# Patient Record
Sex: Male | Born: 1955
Health system: Southern US, Community
[De-identification: ages and names within clinical notes are randomized; demographics above are authoritative.]

## PROBLEM LIST (undated history)

## (undated) DIAGNOSIS — C8590 Non-Hodgkin lymphoma, unspecified, unspecified site: Secondary | ICD-10-CM

## (undated) DIAGNOSIS — E785 Hyperlipidemia, unspecified: Secondary | ICD-10-CM

## (undated) DIAGNOSIS — K7689 Other specified diseases of liver: Secondary | ICD-10-CM

## (undated) DIAGNOSIS — I10 Essential (primary) hypertension: Secondary | ICD-10-CM

## (undated) DIAGNOSIS — K219 Gastro-esophageal reflux disease without esophagitis: Secondary | ICD-10-CM

## (undated) DIAGNOSIS — K862 Cyst of pancreas: Secondary | ICD-10-CM

## (undated) DIAGNOSIS — K635 Polyp of colon: Secondary | ICD-10-CM

## (undated) DIAGNOSIS — R42 Dizziness and giddiness: Secondary | ICD-10-CM

## (undated) DIAGNOSIS — C8400 Mycosis fungoides, unspecified site: Secondary | ICD-10-CM

## (undated) DIAGNOSIS — I639 Cerebral infarction, unspecified: Secondary | ICD-10-CM

## (undated) HISTORY — PX: TONSILLECTOMY AND ADENOIDECTOMY: SHX28

## (undated) HISTORY — DX: Dizziness and giddiness: R42

## (undated) HISTORY — PX: OTHER SURGICAL HISTORY: SHX169

## (undated) HISTORY — PX: RETINAL DETACHMENT SURGERY: SHX105

## (undated) HISTORY — DX: Cerebral infarction, unspecified: I63.9

## (undated) HISTORY — DX: Other specified diseases of liver: K76.89

## (undated) HISTORY — DX: Mycosis fungoides, unspecified site: C84.00

## (undated) HISTORY — PX: TONSILLECTOMY: SUR1361

## (undated) HISTORY — PX: WISDOM TOOTH EXTRACTION: SHX21

## (undated) HISTORY — DX: Cyst of pancreas: K86.2

## (undated) HISTORY — PX: COLONOSCOPY: SHX174

---
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
## 2008-12-22 HISTORY — PX: OTHER SURGICAL HISTORY: SHX169

## 2017-02-27 DIAGNOSIS — L309 Dermatitis, unspecified: Secondary | ICD-10-CM | POA: Diagnosis not present

## 2017-02-27 DIAGNOSIS — I10 Essential (primary) hypertension: Secondary | ICD-10-CM | POA: Diagnosis not present

## 2017-04-21 DIAGNOSIS — C8590 Non-Hodgkin lymphoma, unspecified, unspecified site: Secondary | ICD-10-CM

## 2017-04-21 HISTORY — DX: Non-Hodgkin lymphoma, unspecified, unspecified site: C85.90

## 2017-05-14 DIAGNOSIS — R238 Other skin changes: Secondary | ICD-10-CM | POA: Diagnosis not present

## 2017-05-14 DIAGNOSIS — D485 Neoplasm of uncertain behavior of skin: Secondary | ICD-10-CM | POA: Diagnosis not present

## 2017-05-14 DIAGNOSIS — C8409 Mycosis fungoides, extranodal and solid organ sites: Secondary | ICD-10-CM | POA: Diagnosis not present

## 2017-06-10 DIAGNOSIS — C8400 Mycosis fungoides, unspecified site: Secondary | ICD-10-CM | POA: Diagnosis not present

## 2017-06-10 DIAGNOSIS — I10 Essential (primary) hypertension: Secondary | ICD-10-CM | POA: Diagnosis not present

## 2017-06-29 DIAGNOSIS — C8409 Mycosis fungoides, extranodal and solid organ sites: Secondary | ICD-10-CM | POA: Diagnosis not present

## 2017-06-29 DIAGNOSIS — R21 Rash and other nonspecific skin eruption: Secondary | ICD-10-CM | POA: Diagnosis not present

## 2017-06-29 DIAGNOSIS — C84A0 Cutaneous T-cell lymphoma, unspecified, unspecified site: Secondary | ICD-10-CM | POA: Diagnosis not present

## 2017-06-29 DIAGNOSIS — L986 Other infiltrative disorders of the skin and subcutaneous tissue: Secondary | ICD-10-CM | POA: Diagnosis not present

## 2017-07-07 DIAGNOSIS — L986 Other infiltrative disorders of the skin and subcutaneous tissue: Secondary | ICD-10-CM | POA: Diagnosis not present

## 2017-07-28 DIAGNOSIS — C8400 Mycosis fungoides, unspecified site: Secondary | ICD-10-CM | POA: Diagnosis not present

## 2017-07-29 DIAGNOSIS — E291 Testicular hypofunction: Secondary | ICD-10-CM | POA: Diagnosis not present

## 2017-07-29 DIAGNOSIS — I10 Essential (primary) hypertension: Secondary | ICD-10-CM | POA: Diagnosis not present

## 2017-08-25 DIAGNOSIS — Z0389 Encounter for observation for other suspected diseases and conditions ruled out: Secondary | ICD-10-CM | POA: Diagnosis not present

## 2017-08-25 DIAGNOSIS — E291 Testicular hypofunction: Secondary | ICD-10-CM | POA: Diagnosis not present

## 2017-08-25 DIAGNOSIS — I10 Essential (primary) hypertension: Secondary | ICD-10-CM | POA: Diagnosis not present

## 2017-10-09 DIAGNOSIS — Z23 Encounter for immunization: Secondary | ICD-10-CM | POA: Diagnosis not present

## 2017-10-29 DIAGNOSIS — I10 Essential (primary) hypertension: Secondary | ICD-10-CM | POA: Diagnosis not present

## 2017-10-30 ENCOUNTER — Encounter (INDEPENDENT_AMBULATORY_CARE_PROVIDER_SITE_OTHER): Payer: Self-pay | Admitting: *Deleted

## 2017-12-08 DIAGNOSIS — C8400 Mycosis fungoides, unspecified site: Secondary | ICD-10-CM | POA: Insufficient documentation

## 2017-12-08 DIAGNOSIS — L986 Other infiltrative disorders of the skin and subcutaneous tissue: Secondary | ICD-10-CM | POA: Diagnosis not present

## 2017-12-23 DIAGNOSIS — C8400 Mycosis fungoides, unspecified site: Secondary | ICD-10-CM | POA: Diagnosis not present

## 2017-12-30 ENCOUNTER — Other Ambulatory Visit (INDEPENDENT_AMBULATORY_CARE_PROVIDER_SITE_OTHER): Payer: Self-pay | Admitting: *Deleted

## 2017-12-30 ENCOUNTER — Encounter (INDEPENDENT_AMBULATORY_CARE_PROVIDER_SITE_OTHER): Payer: Self-pay | Admitting: *Deleted

## 2017-12-30 DIAGNOSIS — Z85038 Personal history of other malignant neoplasm of large intestine: Secondary | ICD-10-CM

## 2017-12-30 DIAGNOSIS — Z800 Family history of malignant neoplasm of digestive organs: Secondary | ICD-10-CM | POA: Insufficient documentation

## 2017-12-31 DIAGNOSIS — C8400 Mycosis fungoides, unspecified site: Secondary | ICD-10-CM | POA: Diagnosis not present

## 2018-01-07 DIAGNOSIS — R9721 Rising PSA following treatment for malignant neoplasm of prostate: Secondary | ICD-10-CM | POA: Diagnosis not present

## 2018-01-07 DIAGNOSIS — N529 Male erectile dysfunction, unspecified: Secondary | ICD-10-CM | POA: Diagnosis not present

## 2018-01-07 DIAGNOSIS — R5383 Other fatigue: Secondary | ICD-10-CM | POA: Diagnosis not present

## 2018-01-07 DIAGNOSIS — Z125 Encounter for screening for malignant neoplasm of prostate: Secondary | ICD-10-CM | POA: Diagnosis not present

## 2018-01-07 DIAGNOSIS — I10 Essential (primary) hypertension: Secondary | ICD-10-CM | POA: Diagnosis not present

## 2018-01-07 DIAGNOSIS — R972 Elevated prostate specific antigen [PSA]: Secondary | ICD-10-CM | POA: Diagnosis not present

## 2018-01-19 DIAGNOSIS — R972 Elevated prostate specific antigen [PSA]: Secondary | ICD-10-CM | POA: Diagnosis not present

## 2018-01-22 DIAGNOSIS — G459 Transient cerebral ischemic attack, unspecified: Secondary | ICD-10-CM

## 2018-01-22 HISTORY — DX: Transient cerebral ischemic attack, unspecified: G45.9

## 2018-01-30 ENCOUNTER — Emergency Department (HOSPITAL_COMMUNITY): Payer: 59

## 2018-01-30 ENCOUNTER — Observation Stay (HOSPITAL_COMMUNITY)
Admission: EM | Admit: 2018-01-30 | Discharge: 2018-02-01 | Disposition: A | Discharge status: Home / Self Care | Payer: 59 | Attending: Internal Medicine | Admitting: Internal Medicine

## 2018-01-30 ENCOUNTER — Encounter (HOSPITAL_COMMUNITY): Payer: Self-pay | Admitting: Emergency Medicine

## 2018-01-30 DIAGNOSIS — C8590 Non-Hodgkin lymphoma, unspecified, unspecified site: Secondary | ICD-10-CM | POA: Diagnosis not present

## 2018-01-30 DIAGNOSIS — Z7982 Long term (current) use of aspirin: Secondary | ICD-10-CM | POA: Diagnosis not present

## 2018-01-30 DIAGNOSIS — E041 Nontoxic single thyroid nodule: Secondary | ICD-10-CM | POA: Insufficient documentation

## 2018-01-30 DIAGNOSIS — E785 Hyperlipidemia, unspecified: Secondary | ICD-10-CM | POA: Insufficient documentation

## 2018-01-30 DIAGNOSIS — E669 Obesity, unspecified: Secondary | ICD-10-CM | POA: Diagnosis not present

## 2018-01-30 DIAGNOSIS — Z6832 Body mass index (BMI) 32.0-32.9, adult: Secondary | ICD-10-CM | POA: Insufficient documentation

## 2018-01-30 DIAGNOSIS — I10 Essential (primary) hypertension: Secondary | ICD-10-CM | POA: Insufficient documentation

## 2018-01-30 DIAGNOSIS — I639 Cerebral infarction, unspecified: Secondary | ICD-10-CM | POA: Diagnosis not present

## 2018-01-30 DIAGNOSIS — R42 Dizziness and giddiness: Secondary | ICD-10-CM | POA: Diagnosis not present

## 2018-01-30 HISTORY — DX: Essential (primary) hypertension: I10

## 2018-01-30 HISTORY — DX: Non-Hodgkin lymphoma, unspecified, unspecified site: C85.90

## 2018-01-30 LAB — BASIC METABOLIC PANEL
Anion gap: 9 (ref 5–15)
BUN: 17 mg/dL (ref 6–20)
CALCIUM: 9.5 mg/dL (ref 8.9–10.3)
CO2: 28 mmol/L (ref 22–32)
CREATININE: 0.81 mg/dL (ref 0.61–1.24)
Chloride: 101 mmol/L (ref 101–111)
GFR calc non Af Amer: >60 mL/min (ref >60)
Glucose, Bld: 121 mg/dL — ABNORMAL HIGH (ref 65–99)
Potassium: 4.1 mmol/L (ref 3.5–5.1)
SODIUM: 138 mmol/L (ref 135–145)

## 2018-01-30 LAB — URINALYSIS, ROUTINE W REFLEX MICROSCOPIC
Bilirubin Urine: NEGATIVE
Glucose, UA: NEGATIVE mg/dL
HGB URINE DIPSTICK: NEGATIVE
KETONES UR: NEGATIVE mg/dL
LEUKOCYTES UA: NEGATIVE
Nitrite: NEGATIVE
PROTEIN: NEGATIVE mg/dL
Specific Gravity, Urine: 1.008 (ref 1.005–1.030)
pH: 8 (ref 5.0–8.0)

## 2018-01-30 LAB — CBC
HCT: 47 % (ref 39.0–52.0)
Hemoglobin: 15.7 g/dL (ref 13.0–17.0)
MCH: 30.8 pg (ref 26.0–34.0)
MCHC: 33.4 g/dL (ref 30.0–36.0)
MCV: 92.3 fL (ref 78.0–100.0)
PLATELETS: 228 10*3/uL (ref 150–400)
RBC: 5.09 MIL/uL (ref 4.22–5.81)
RDW: 14.2 % (ref 11.5–15.5)
WBC: 9.4 10*3/uL (ref 4.0–10.5)

## 2018-01-30 LAB — CBG MONITORING, ED: GLUCOSE-CAPILLARY: 130 mg/dL — AB (ref 65–99)

## 2018-01-30 MED ORDER — ASPIRIN 300 MG RE SUPP: 300.0000 mg | Freq: Every day | RECTAL | Status: DC

## 2018-01-30 MED ORDER — ACETAMINOPHEN 325 MG PO TABS: 650.0000 mg | ORAL_TABLET | ORAL | Status: DC | PRN

## 2018-01-30 MED ORDER — ASPIRIN 81 MG PO CHEW
324.0000 mg | CHEWABLE_TABLET | Freq: Once | ORAL | Status: AC
  Administered 2018-01-30: 324 mg via ORAL
  Filled 2018-01-30: qty 4

## 2018-01-30 MED ORDER — ACETAMINOPHEN 650 MG RE SUPP: 650.0000 mg | RECTAL | Status: DC | PRN

## 2018-01-30 MED ORDER — ACETAMINOPHEN 160 MG/5ML PO SOLN: 650.0000 mg | ORAL | Status: DC | PRN

## 2018-01-30 MED ORDER — STROKE: EARLY STAGES OF RECOVERY BOOK
Freq: Once | Status: AC
  Administered 2018-02-01: 08:00:00
  Filled 2018-01-30: qty 1

## 2018-01-30 MED ORDER — MECLIZINE HCL 12.5 MG PO TABS
25.0000 mg | ORAL_TABLET | Freq: Once | ORAL | Status: AC
  Administered 2018-01-30: 25 mg via ORAL
  Filled 2018-01-30: qty 2

## 2018-01-30 MED ORDER — ASPIRIN 325 MG PO TABS
325.0000 mg | ORAL_TABLET | Freq: Every day | ORAL | Status: DC
  Administered 2018-01-31 – 2018-02-01 (×2): 325 mg via ORAL
  Filled 2018-01-30 (×2): qty 1

## 2018-01-30 NOTE — H&P (Signed)
History and Physical    Scott Mathis VVO:[id number] DOB: [birth year] DOA: 01/30/2018  PCP: Doree Albee, MD  Patient coming from: home  Chief Complaint: Imbalance and dizziness  HPI: Scott Mathis is a 62 y.o. male with medical history significant of hypertension, lymphoma on methotrexate comes in with being dizzy and very imbalanced in his gait since 3 AM this morning.  He went to bed last night normal.  He woke up this morning at 3 AM to go to the bathroom.  He noticed that he was extremely dizzy and was unbalanced.  He went back to bed and fell asleep.  Woke up again later this morning and the symptoms persisted.  Patient denies any focal weakness anywhere.  He denies any facial drooping.  He denies any numbness tingling in his face or any extremities.  He denies any recent fever.  No recent nausea vomiting or diarrhea.  He still gets very dizzy when he gets up to move.  His gait has not improved either.  Patient has been found to have a possible infarct in his cerebellum on initial CAT scan.  Patient is referred for admission for possible stroke.  Neurology at  [facility] has been called who recommended transfer to [facility] for further neurological evaluation.  Review of Systems: As per HPI otherwise 10 point review of systems negative.   Past Medical History:  Diagnosis Date  . Hypertension   . Lymphoma [facility])     Past Surgical History:  Procedure Laterality Date  . TONSILLECTOMY       reports that  has never smoked. He does not have any smokeless tobacco history on file. He reports that he does not drink alcohol or use drugs.  No Known Allergies  History reviewed. No pertinent family history. no premature CAD he has a twin brother who is identical who also has lymphoma no strokes run in the family.  Prior to Admission medications   Medication Sig Start Date End Date Taking? Authorizing Provider  amLODipine (NORVASC) 5 MG tablet Take 5 mg by mouth daily. 01/13/18  Yes  [provider]  aspirin 81 MG EC tablet Take 81 mg by mouth daily. Swallow whole.   Yes [provider]  folic acid (FOLVITE) 1 MG tablet Take 1 mg by mouth daily. Daily for 6 days. Methotrexate on the 7th day. Repeat 01/12/18  Yes [provider]  methotrexate (RHEUMATREX) 2.5 MG tablet Take 2.5 mg by mouth every 7 (seven) days. Takes Folic Acid on the other 6 days. 12/08/17  Yes [provider]  Multiple Vitamins-Minerals (MULTIVITAMIN MEN 50+) TABS Take 1 tablet by mouth daily.   Yes [provider]  Omega-3 Fatty Acids (FISH OIL) 1200 MG CAPS Take 1 capsule by mouth daily.   Yes [provider]    Physical Exam: Vitals:   01/30/18 1142 01/30/18 1144 01/30/18 1458 01/30/18 1658  BP:  (!) 148/85 (!) 146/87 (!) 143/90  Pulse:  66 60 67  Resp:  18 15 17   Temp:  97.8 F (36.6 C)    TempSrc:  Oral    SpO2:  99% 96% 97%  Weight: 97.5 kg (215 lb)     Height: 5\' 8"  (1.727 m)        Constitutional: NAD, calm, comfortable Vitals:   01/30/18 1142 01/30/18 1144 01/30/18 1458 01/30/18 1658  BP:  (!) 148/85 (!) 146/87 (!) 143/90  Pulse:  66 60 67  Resp:  18 15 17   Temp:  97.8 F (36.6 C)    TempSrc:  Oral    SpO2:  99% 96% 97%  Weight: 97.5 kg (215 lb)     Height: 5\' 8"  (1.727 m)      Eyes: PERRL, lids and conjunctivae normal ENMT: Mucous membranes are moist. Posterior pharynx clear of any exudate or lesions.Normal dentition.  Neck: normal, supple, no masses, no thyromegaly Respiratory: clear to auscultation bilaterally, no wheezing, no crackles. Normal respiratory effort. No accessory muscle use.  Cardiovascular: Regular rate and rhythm, no murmurs / rubs / gallops. No extremity edema. 2+ pedal pulses. No carotid bruits.  Abdomen: no tenderness, no masses palpated. No hepatosplenomegaly. Bowel sounds positive.  Musculoskeletal: no clubbing / cyanosis. No joint deformity upper and lower extremities. Good ROM, no contractures. Normal  muscle tone.  Skin: no rashes, lesions, ulcers. No induration Neurologic: CN 2-12 grossly intact. Sensation intact, DTR normal. Strength 5/5 in all 4.  Psychiatric: Normal judgment and insight. Alert and oriented x 3. Normal mood.    Labs on Admission: I have personally reviewed following labs and imaging studies  CBC: Recent Labs  Lab 01/30/18 1252  WBC 9.4  HGB 15.7  HCT 47.0  MCV 92.3  PLT 932   Basic Metabolic Panel: Recent Labs  Lab 01/30/18 1252  NA 138  K 4.1  CL 101  CO2 28  GLUCOSE 121*  BUN 17  CREATININE 0.81  CALCIUM 9.5   GFR: Estimated Creatinine Clearance: 108.4 mL/min (by C-G formula based on SCr of 0.81 mg/dL). Liver Function Tests: No results for input(s): AST, ALT, ALKPHOS, BILITOT, PROT, ALBUMIN in the last 168 hours. No results for input(s): LIPASE, AMYLASE in the last 168 hours. No results for input(s): AMMONIA in the last 168 hours. Coagulation Profile: No results for input(s): INR, PROTIME in the last 168 hours. Cardiac Enzymes: No results for input(s): CKTOTAL, CKMB, CKMBINDEX, TROPONINI in the last 168 hours. BNP (last 3 results) No results for input(s): PROBNP in the last 8760 hours. HbA1C: No results for input(s): HGBA1C in the last 72 hours. CBG: Recent Labs  Lab 01/30/18 1148  GLUCAP 130*   Lipid Profile: No results for input(s): CHOL, HDL, LDLCALC, TRIG, CHOLHDL, LDLDIRECT in the last 72 hours. Thyroid Function Tests: No results for input(s): TSH, T4TOTAL, FREET4, T3FREE, THYROIDAB in the last 72 hours. Anemia Panel: No results for input(s): VITAMINB12, FOLATE, FERRITIN, TIBC, IRON, RETICCTPCT in the last 72 hours. Urine analysis:    Component Value Date/Time   COLORURINE STRAW (A) 01/30/2018 1146   APPEARANCEUR HAZY (A) 01/30/2018 1146   LABSPEC 1.008 01/30/2018 1146   PHURINE 8.0 01/30/2018 1146   GLUCOSEU NEGATIVE 01/30/2018 1146   HGBUR NEGATIVE 01/30/2018 1146   BILIRUBINUR NEGATIVE 01/30/2018 1146   KETONESUR  NEGATIVE 01/30/2018 1146   PROTEINUR NEGATIVE 01/30/2018 1146   NITRITE NEGATIVE 01/30/2018 1146   LEUKOCYTESUR NEGATIVE 01/30/2018 1146   Sepsis Labs: !!!!!!!!!!!!!!!!!!!!!!!!!!!!!!!!!!!!!!!!!!!! @LABRCNTIP (procalcitonin:4,lacticidven:4) )No results found for this or any previous visit (from the past 240 hour(s)).   Radiological Exams on Admission: Ct Head Wo Contrast  Result Date: 01/30/2018 CLINICAL DATA:  Dizziness and nausea. Persistent gait disturbance. History of lymphoma. EXAM: CT HEAD WITHOUT CONTRAST TECHNIQUE: Contiguous axial images were obtained from the base of the skull through the vertex without intravenous contrast. COMPARISON:  None. FINDINGS: Brain: The ventricles are normal in size and configuration. There is no mass, hemorrhage, extra-axial fluid collection, or midline shift. There is decreased attenuation in the right mid cerebellum along the lateral aspect the  right dentate nucleus compared to the left side. A focal developing infarct in the right mid cerebellum must be of concern given this appearance. Elsewhere gray-white compartments appear normal. Vascular: No hyperdense vessel evident. There is calcification in each carotid siphon. Skull: The bony calvarium appears intact. Sinuses/Orbits: There is mucosal thickening in several ethmoid air cells. Other visualized paranasal sinuses are clear. Orbits appear symmetric bilaterally. Other: Mastoid air cells are clear. IMPRESSION: Focal decreased attenuation in the mid right cerebellum in the lateral right dentate nucleus region compared to the left side. Early infarct in this area must be of concern. Elsewhere gray-white compartments appear normal. No mass or hemorrhage. There are foci of arterial vascular calcification. There is mucosal thickening in several ethmoid air cells. Electronically Signed   By: Lowella Grip III M.D.   On: 01/30/2018 15:27    EKG: Independently reviewed. nsr no acute issues Case discussed with Dr.  Vanita Panda in the emergency department   Assessment/Plan 62 year old male with gait abnormality and dizziness with possible early infarct on CT of head Principal Problem:   Stroke (HCC)-transfer to call for full evaluation for stroke.  Obtain MRI of the brain to assess if indeed infarct.  Obtain carotid Dopplers.  Obtain cardiac echo.  Placed on full dose of aspirin.  Placed on telemetry monitoring.  Check fasting lipid panel and hemoglobin A1c.  Obtain frequent neurological checks.  I have advised the patient to let us know if he has any worsening neurological symptoms.  Patient be transferred to [facility] for inpatient neurological consultation and evaluation.  Have added a care order to call neuro team on arrival to come.  Active Problems:   Lymphoma (HCC)-holding methotrexate   Hypertension-allow permissive hypertension at this time     DVT prophylaxis:  scds Code Status:  full Family Communication: wife Disposition Plan:  Per day team Consults called:  [facility] Admission status:  admission   Roni,Catalena Stanhope A MD [facility]  If 7PM-7AM, please contact night-coverage [email or web address] Password [email or web address]  01/30/2018, 5:13 PM

## 2018-01-30 NOTE — ED Triage Notes (Signed)
Pt reports he woke up at 0300 to use the bathroom and became very dizzy with the room spinning and nausea.  Pt very off balance when walking into triage.

## 2018-01-30 NOTE — ED Notes (Signed)
EKG given to Dr. McManus.  

## 2018-01-30 NOTE — ED Provider Notes (Signed)
[facility] EMERGENCY DEPARTMENT Provider Note   CSN: [id number] Arrival date & time: 01/30/18  1135     History   Chief Complaint Chief Complaint  Patient presents with  . Dizziness    HPI Scott Mathis is a 62 y.o. male.  HPI Patient presents with dizziness. Patient has history of hypertension and lymphoma, is currently taking methotrexate and folic acid for treatment. He awoke this morning about 6 hours ago, with dizziness, described as disequilibrium, not near syncope. Since onset symptoms been persistent, difficulty with ambulation Symptoms are worse with motion, ambulation. Symptoms are minimally better at rest. No vomiting, though he is nauseous. No confusion, disorientation, vision changes. He is here with his wife who assists with the HPI. Past Medical History:  Diagnosis Date  . Hypertension   . Lymphoma [facility])     Patient Active Problem List   Diagnosis Date Noted  . Stroke ([place]) 01/30/2018  . Lymphoma ([place])   . Hypertension   . Family history of colon cancer 12/30/2017  . History of colon cancer 12/30/2017    Past Surgical History:  Procedure Laterality Date  . TONSILLECTOMY         Home Medications    Prior to Admission medications   Medication Sig Start Date End Date Taking? Authorizing Provider  amLODipine (NORVASC) 5 MG tablet Take 5 mg by mouth daily. 01/13/18  Yes [provider]  aspirin 81 MG EC tablet Take 81 mg by mouth daily. Swallow whole.   Yes [provider]  folic acid (FOLVITE) 1 MG tablet Take 1 mg by mouth daily. Daily for 6 days. Methotrexate on the 7th day. Repeat 01/12/18  Yes [provider]  methotrexate (RHEUMATREX) 2.5 MG tablet Take 2.5 mg by mouth every 7 (seven) days. Takes Folic Acid on the other 6 days. 12/08/17  Yes [provider]  Multiple Vitamins-Minerals (MULTIVITAMIN MEN 50+) TABS Take 1 tablet by mouth daily.   Yes [provider]  Omega-3 Fatty Acids (FISH OIL)  1200 MG CAPS Take 1 capsule by mouth daily.   Yes [provider]    Family History History reviewed. No pertinent family history.  Social History Social History   Tobacco Use  . Smoking status: Never Smoker  Substance Use Topics  . Alcohol use: No    Frequency: Never  . Drug use: No     Allergies   Patient has no known allergies.   Review of Systems Review of Systems  Constitutional:       Per HPI, otherwise negative  HENT:       Per HPI, otherwise negative  Respiratory:       Per HPI, otherwise negative  Cardiovascular:       Per HPI, otherwise negative  Gastrointestinal: Negative for vomiting.  Endocrine:       Negative aside from HPI  Genitourinary:       Neg aside from HPI   Musculoskeletal:       Per HPI, otherwise negative  Skin: Negative.   Allergic/Immunologic: Positive for immunocompromised state.  Neurological: Positive for dizziness. Negative for syncope.     Physical Exam Updated Vital Signs BP (!) 160/89 (BP Location: Right Arm)   Pulse 60   Temp 97.9 F (36.6 C) (Oral)   Resp 15   Ht 5\' 8"  (1.727 m)   Wt 97.5 kg (215 lb)   SpO2 98%   BMI 32.69 kg/m   Physical Exam  Constitutional: He is oriented to person, place,  and time. He appears well-developed. No distress.  HENT:  Head: Normocephalic and atraumatic.  Eyes: Conjunctivae and EOM are normal.  Cardiovascular: Normal rate and regular rhythm.  Pulmonary/Chest: Effort normal. No stridor. No respiratory distress.  Abdominal: He exhibits no distension.  Musculoskeletal: He exhibits no edema.  Neurological: He is alert and oriented to person, place, and time. No cranial nerve deficit. He exhibits normal muscle tone. Coordination normal.  Head motion with flexion/extension of the neck provokes symptoms.  Skin: Skin is warm and dry.  Psychiatric: He has a normal mood and affect.  Nursing note and vitals reviewed.    ED Treatments / Results  Labs (all labs ordered are  listed, but only abnormal results are displayed) Labs Reviewed  BASIC METABOLIC PANEL - Abnormal; Notable for the following components:      Result Value   Glucose, Bld 121 (*)    All other components within normal limits  URINALYSIS, ROUTINE W REFLEX MICROSCOPIC - Abnormal; Notable for the following components:   Color, Urine STRAW (*)    APPearance HAZY (*)    All other components within normal limits  CBG MONITORING, ED - Abnormal; Notable for the following components:   Glucose-Capillary 130 (*)    All other components within normal limits  CBC  HIV ANTIBODY (ROUTINE TESTING)  HEMOGLOBIN A1C  LIPID PANEL    EKG  EKG Interpretation  Date/Time:  Saturday January 30 2018 11:43:04 EST Ventricular Rate:  66 PR Interval:  166 QRS Duration: 98 QT Interval:  412 QTC Calculation: 431 R Axis:   58 Text Interpretation:  Normal sinus rhythm Minimal voltage criteria for LVH, may be normal variant Borderline ECG No previous ECGs available Confirmed by Fredia Sorrow [phone]) on 01/30/2018 5:00:38 PM       Radiology Ct Head Wo Contrast  Result Date: 01/30/2018 CLINICAL DATA:  Dizziness and nausea. Persistent gait disturbance. History of lymphoma. EXAM: CT HEAD WITHOUT CONTRAST TECHNIQUE: Contiguous axial images were obtained from the base of the skull through the vertex without intravenous contrast. COMPARISON:  None. FINDINGS: Brain: The ventricles are normal in size and configuration. There is no mass, hemorrhage, extra-axial fluid collection, or midline shift. There is decreased attenuation in the right mid cerebellum along the lateral aspect the right dentate nucleus compared to the left side. A focal developing infarct in the right mid cerebellum must be of concern given this appearance. Elsewhere gray-white compartments appear normal. Vascular: No hyperdense vessel evident. There is calcification in each carotid siphon. Skull: The bony calvarium appears intact. Sinuses/Orbits: There is  mucosal thickening in several ethmoid air cells. Other visualized paranasal sinuses are clear. Orbits appear symmetric bilaterally. Other: Mastoid air cells are clear. IMPRESSION: Focal decreased attenuation in the mid right cerebellum in the lateral right dentate nucleus region compared to the left side. Early infarct in this area must be of concern. Elsewhere gray-white compartments appear normal. No mass or hemorrhage. There are foci of arterial vascular calcification. There is mucosal thickening in several ethmoid air cells. Electronically Signed   By: Lowella Grip III M.D.   On: 01/30/2018 15:27    Procedures Procedures (including critical care time)  Medications Ordered in ED Medications   stroke: mapping our early stages of recovery book (not administered)  acetaminophen (TYLENOL) tablet 650 mg (not administered)    Or  acetaminophen (TYLENOL) solution 650 mg (not administered)    Or  acetaminophen (TYLENOL) suppository 650 mg (not administered)  aspirin suppository 300 mg (not administered)  Or  aspirin tablet 325 mg (not administered)  meclizine (ANTIVERT) tablet 25 mg (25 mg Oral Given 01/30/18 1503)  aspirin chewable tablet 324 mg (324 mg Oral Given 01/30/18 1642)     Initial Impression / Assessment and Plan / ED Course  I have reviewed the triage vital signs and the nursing notes.  Pertinent labs & imaging results that were available during my care of the patient were reviewed by me and considered in my medical decision making (see chart for details).     On repeat exam the patient is in similar condition, persistent dizziness, mild.  Update:, I have discussed the patient's CT with him and with her neurologist. Given patient's description of new dizziness, onset sudden, this morning and abnormal cerebellar finding on CT, there is some concern for stroke. Patient will require transfer to our facility and stroke center for further evaluation, management, MRI. Patient's  studies otherwise are reassuring, with no other indication for other acute pathology. Patient and his wife are aware of all findings thus far, need for further evaluation, management, monitoring. Patient appropriate for transfer.  Final Clinical Impressions(s) / ED Diagnoses   Final diagnoses:  Lymphoma, unspecified body region, unspecified lymphoma type [facility])  Essential hypertension  Stroke [facility])    ED Discharge Orders    None       Carmin Muskrat, MD 01/30/18 2121

## 2018-01-31 ENCOUNTER — Encounter (HOSPITAL_COMMUNITY): Payer: Self-pay

## 2018-01-31 ENCOUNTER — Inpatient Hospital Stay (HOSPITAL_COMMUNITY): Payer: 59

## 2018-01-31 ENCOUNTER — Other Ambulatory Visit: Payer: Self-pay

## 2018-01-31 DIAGNOSIS — I6300 Cerebral infarction due to thrombosis of unspecified precerebral artery: Secondary | ICD-10-CM | POA: Diagnosis not present

## 2018-01-31 DIAGNOSIS — I6521 Occlusion and stenosis of right carotid artery: Secondary | ICD-10-CM | POA: Diagnosis not present

## 2018-01-31 DIAGNOSIS — I10 Essential (primary) hypertension: Secondary | ICD-10-CM | POA: Diagnosis not present

## 2018-01-31 DIAGNOSIS — R9089 Other abnormal findings on diagnostic imaging of central nervous system: Secondary | ICD-10-CM | POA: Diagnosis not present

## 2018-01-31 DIAGNOSIS — C8590 Non-Hodgkin lymphoma, unspecified, unspecified site: Secondary | ICD-10-CM | POA: Diagnosis not present

## 2018-01-31 DIAGNOSIS — I639 Cerebral infarction, unspecified: Secondary | ICD-10-CM | POA: Diagnosis not present

## 2018-01-31 MED ORDER — GADOBENATE DIMEGLUMINE 529 MG/ML IV SOLN
20.0000 mL | Freq: Once | INTRAVENOUS | Status: AC | PRN
  Administered 2018-01-31: 20 mL via INTRAVENOUS

## 2018-01-31 MED ORDER — IOPAMIDOL (ISOVUE-370) INJECTION 76%
INTRAVENOUS | Status: AC
  Administered 2018-01-31: 50 mL
  Filled 2018-01-31: qty 50

## 2018-01-31 MED ORDER — FOLIC ACID 1 MG PO TABS
1.0000 mg | ORAL_TABLET | Freq: Every day | ORAL | Status: DC
  Administered 2018-02-01: 1 mg via ORAL
  Filled 2018-01-31: qty 1

## 2018-01-31 NOTE — Evaluation (Signed)
Occupational Therapy Evaluation Patient Details Name: Scott Mathis MRN: [id number] DOB: 10-09-1956 Today's Date: 01/31/2018    History of Present Illness 62 y.o. 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 MRI pending.   Clinical Impression   Pt admitted with the above diagnoses and presents with below problem list. Pt will benefit from continued acute OT to address the below listed deficits and maximize independence with basic ADLs prior to d/c home. PTA pt was independent with ADLs, active, works from home. Pt presents with higher level balance deficits. Functionally he is mostly supervision with functional transfers and LB ADLs, occasional min guard for dynamic tasks. Will continue to follow acutely.       Follow Up Recommendations  No OT follow up    Equipment Recommendations  None recommended by OT    Recommendations for Other Services PT consult     Precautions / Restrictions Restrictions Weight Bearing Restrictions: No      Mobility Bed Mobility Overal bed mobility: Independent                Transfers Overall transfer level: Needs assistance Equipment used: None Transfers: Sit to/from Stand Sit to Stand: Supervision         General transfer comment: supervision for safety    Balance Overall balance assessment: Needs assistance Sitting-balance support: No upper extremity supported;Feet supported Sitting balance-Leahy Scale: Good     Standing balance support: No upper extremity supported Standing balance-Leahy Scale: Good Standing balance comment: higher level deficits                           ADL either performed or assessed with clinical judgement   ADL Overall ADL's : Needs assistance/impaired Eating/Feeding: Set up;Sitting    Grooming: Modified independent;Supervision/safety;Standing   Upper Body Bathing: Set up;Sitting   Lower Body Bathing: Supervison/ safety;Min guard;Sit to/from stand   Upper Body Dressing : Set up;Sitting   Lower Body Dressing: Min guard;Supervision/safety;Sit to/from stand   Toilet Transfer: Supervision/safety;Min guard   Toileting- Water quality scientist and Hygiene: Supervision/safety   Tub/ Shower Transfer: Walk-in shower;Supervision/safety;Min guard;Ambulation   Functional mobility during ADLs: Supervision/safety;Min guard General ADL Comments: Pt completed in-room functional mobility. Higher level balance deficits noted.      Vision Patient Visual Report: No change from baseline       Perception     Praxis      Pertinent Vitals/Pain Pain Assessment: No/denies pain     Hand Dominance     Extremity/Trunk Assessment Upper Extremity Assessment Upper Extremity Assessment: Overall WFL for tasks assessed   Lower Extremity Assessment Lower Extremity Assessment: Defer to PT evaluation   Cervical / Trunk Assessment Cervical / Trunk Assessment: Normal   Communication Communication Communication: No difficulties   Cognition Arousal/Alertness: Awake/alert Behavior During Therapy: WFL for tasks assessed/performed Overall Cognitive Status: Within Functional Limits for tasks assessed                                     General Comments       Exercises     Shoulder Instructions      Home Living Family/patient expects to be discharged to:: Private residence Living Arrangements: Spouse/significant other Available Help at Discharge: Family  Type of Home: House Home Access: Stairs to enter [employer] of Steps: 3 Entrance Stairs-Rails: Right;Left;Can reach both Home Layout: One level     Bathroom Shower/Tub: Chief Strategy Officer: None          Prior Functioning/Environment Level of Independence: Independent         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        OT Problem List: Impaired balance (sitting and/or standing);Decreased knowledge of use of DME or AE;Decreased knowledge of precautions      OT Treatment/Interventions: Self-care/ADL training;Neuromuscular education;DME and/or AE instruction;Therapeutic activities;Patient/family education;Balance training    OT Goals(Current goals can be found in the care plan section) Acute Rehab OT Goals Patient Stated Goal: home OT Goal Formulation: With patient Time For Goal Achievement: 02/07/18 Potential to Achieve Goals: Good ADL Goals Pt Will Perform Grooming: Independently;standing Pt Will Perform Lower Body Dressing: Independently;sit to/from stand  OT Frequency: Min 2X/week   Barriers to D/C:            Co-evaluation              AM-PAC PT "6 Clicks" Daily Activity     Outcome Measure Help from another person eating meals?: None Help from another person taking care of personal grooming?: None Help from another person toileting, which includes using toliet, bedpan, or urinal?: A Little Help from another person bathing (including washing, rinsing, drying)?: A Little Help from another person to put on and taking off regular upper body clothing?: None Help from another person to put on and taking off regular lower body clothing?: None 6 Click Score: 22   End of Session    Activity Tolerance: Patient tolerated treatment well Patient left: in bed;with call bell/phone within reach  OT Visit Diagnosis: Unsteadiness on feet (R26.81)                Time: 8675-4492 OT Time Calculation (min): 22 min Charges:  OT General Charges $OT Visit: 1 Visit OT Evaluation $OT Eval Low Complexity: 1 Low G-Codes:       Hortencia Pilar 01/31/2018, 10:53 AM

## 2018-01-31 NOTE — Progress Notes (Signed)
PROGRESS NOTE    Reilly Blades  NFA:[id number] DOB: Apr 18, 1956 DOA: 01/30/2018 PCP: Doree Albee, MD   Chief Complaint  Patient presents with  . Dizziness    Brief Narrative:  HPI On 01/30/2018 by Dr. Steward Ros  Tamar Lipscomb is a 62 y.o. male past medical history of hypertension, currently on methotrexate for cutaneous T-cell lymphoma, presenting to the emergency room at [facility] for evaluation of dizziness and unsteady gait upon waking up on the morning of 01/30/2018. He said he went to bed normally the night prior and upon waking up felt dizzy-as an lightheaded as well as room spinning.  When he tried to walk, he felt like he was walking on a ship like a drunken sailor. Denies any preceding neck injury.  Denies any chiropractic maneuvers.  Denies any head injury.  Denies any similar symptoms in the past.  Denies any preceding sicknesses or illnesses.  Denies any drug use or alcohol use.  Denies any headache or visual symptoms including diplopia or blurred vision.  Denies focal tingling numbness or weakness.  Denies slurred speech or aphasia.  Denies chest pain, shortness of breath, cough, fevers, chills.  He does report nausea but no vomiting.  Interim history Admitted for gait instability and dizziness.  Pending MRI and echocardiogram, PT OT.  Neurology consulted.  Assessment & Plan   Gait instability/dizziness, suspect TIA versus CVA -CT head showed focal decreased attenuation in the mid right cerebellum in the lateral right dentate nucleus region compared to the left side.  Early infarct in this area must be of concern. -MRI brain pending -LDL,, hemoglobin A1c pending -Echocardiogram pending -Neurology consulted and appreciated, pending further recommendations -Pending PT, OT -Continue aspirin  T-cell lymphoma -Methotrexate held  Essential hypertension -Home medications held, allowing for permissive hypertension  DVT Prophylaxis SCDs  Code Status:  Full  Family Communication: Wife at bedside  Disposition Plan: Admitted, pending CVA workup and neurology recommendations  Consultants Neurology  Procedures  Home  Antibiotics   Anti-infectives (From admission, onward)   None      Subjective:   Adela Glimpse seen and examined today.  Complains of dizziness and problems with balance when walking.  At rest, sitting, patient no longer has her experiences dizziness.  Denies current chest pain, shortness of breath, abdominal pain, nausea or vomiting, diarrhea constipation, headache, weakness.   Objective:   Vitals:   01/30/18 2307 01/31/18 0000 01/31/18 0314 01/31/18 0957  BP:  138/72 130/79 127/68  Pulse:  (!) 59 64 75  Resp:  16 15 16   Temp: 98.3 F (36.8 C) 97.8 F (36.6 C) 97.6 F (36.4 C) 97.8 F (36.6 C)  TempSrc: Oral Oral Oral Oral  SpO2:  97% 97% 97%  Weight:      Height:        Intake/Output Summary (Last 24 hours) at 01/31/2018 1108 Last data filed at 01/31/2018 0830 Gross per 24 hour  Intake 360 ml  Output -  Net 360 ml   Filed Weights   01/30/18 1142  Weight: 97.5 kg (215 lb)    Exam  General: Well developed, well nourished, NAD, appears stated age  HEENT: NCAT, mucous membranes moist.   Cardiovascular: S1 S2 auscultated, no rubs, murmurs or gallops. Regular rate and rhythm.  Respiratory: Clear to auscultation bilaterally with equal chest rise  Abdomen: Soft, nontender, nondistended, + bowel sounds  Extremities: warm dry without cyanosis clubbing or edema  Neuro: AAOx3, nonfocal, normal strength and sensation in all 4  extremities.  Mild left-sided nystagmus of the left eye  Psych: Normal affect and demeanor with intact judgement and insight   Data Reviewed: I have personally reviewed following labs and imaging studies  CBC: Recent Labs  Lab 01/30/18 1252  WBC 9.4  HGB 15.7  HCT 47.0  MCV 92.3  PLT 412   Basic Metabolic Panel: Recent Labs  Lab 01/30/18 1252  NA 138  K 4.1   CL 101  CO2 28  GLUCOSE 121*  BUN 17  CREATININE 0.81  CALCIUM 9.5   GFR: Estimated Creatinine Clearance: 108.4 mL/min (by C-G formula based on SCr of 0.81 mg/dL). Liver Function Tests: No results for input(s): AST, ALT, ALKPHOS, BILITOT, PROT, ALBUMIN in the last 168 hours. No results for input(s): LIPASE, AMYLASE in the last 168 hours. No results for input(s): AMMONIA in the last 168 hours. Coagulation Profile: No results for input(s): INR, PROTIME in the last 168 hours. Cardiac Enzymes: No results for input(s): CKTOTAL, CKMB, CKMBINDEX, TROPONINI in the last 168 hours. BNP (last 3 results) No results for input(s): PROBNP in the last 8760 hours. HbA1C: No results for input(s): HGBA1C in the last 72 hours. CBG: Recent Labs  Lab 01/30/18 1148  GLUCAP 130*   Lipid Profile: No results for input(s): CHOL, HDL, LDLCALC, TRIG, CHOLHDL, LDLDIRECT in the last 72 hours. Thyroid Function Tests: No results for input(s): TSH, T4TOTAL, FREET4, T3FREE, THYROIDAB in the last 72 hours. Anemia Panel: No results for input(s): VITAMINB12, FOLATE, FERRITIN, TIBC, IRON, RETICCTPCT in the last 72 hours. Urine analysis:    Component Value Date/Time   COLORURINE STRAW (A) 01/30/2018 1146   APPEARANCEUR HAZY (A) 01/30/2018 1146   LABSPEC 1.008 01/30/2018 1146   PHURINE 8.0 01/30/2018 1146   GLUCOSEU NEGATIVE 01/30/2018 1146   HGBUR NEGATIVE 01/30/2018 1146   BILIRUBINUR NEGATIVE 01/30/2018 1146   KETONESUR NEGATIVE 01/30/2018 1146   PROTEINUR NEGATIVE 01/30/2018 1146   NITRITE NEGATIVE 01/30/2018 1146   LEUKOCYTESUR NEGATIVE 01/30/2018 1146   Sepsis Labs: @LABRCNTIP (procalcitonin:4,lacticidven:4)  )No results found for this or any previous visit (from the past 240 hour(s)).    Radiology Studies: Ct Angio Head W Or Wo Contrast  Result Date: 01/31/2018 CLINICAL DATA:  62 y/o M; possible right cerebellar stroke. History of T-cell lymphoma post chemotherapy. EXAM: CT ANGIOGRAPHY HEAD  AND NECK TECHNIQUE: Multidetector CT imaging of the head and neck was performed using the standard protocol during bolus administration of intravenous contrast. Multiplanar CT image reconstructions and MIPs were obtained to evaluate the vascular anatomy. Carotid stenosis measurements (when applicable) are obtained utilizing NASCET criteria, using the distal internal carotid diameter as the denominator. CONTRAST:  26mL ISOVUE-370 IOPAMIDOL (ISOVUE-370) INJECTION 76% COMPARISON:  01/30/2018 CT head FINDINGS: CTA NECK FINDINGS Aortic arch: Standard branching. Imaged portion shows no evidence of aneurysm or dissection. No significant stenosis of the major arch vessel origins. Right carotid system: No evidence of dissection, stenosis (50% or greater) or occlusion. Left carotid system: No evidence of dissection, stenosis (50% or greater) or occlusion. Vertebral arteries: Right dominant. No evidence of dissection, stenosis (50% or greater) or occlusion. Skeleton: Cervical spondylosis with moderate discogenic degenerative changes from C5 through T1 and advanced right-sided facet arthropathy. Right C3-4 uncovertebral and facet hypertrophy results in severe bony foraminal stenosis. Other neck: Right lobe of thyroid 20 mm nodule. Upper chest: Negative. Review of the MIP images confirms the above findings CTA HEAD FINDINGS Anterior circulation: No significant stenosis, proximal occlusion, aneurysm, or vascular malformation. Posterior circulation: Right PICA origin  high-grade stenosis (series 7, image 138). Patent vertebral arteries, basilar artery, and bilateral PCA without high-grade stenosis, aneurysm, occlusion, or vascular malformation. Lower basilar fenestration. Venous sinuses: As permitted by contrast timing, patent. Anatomic variants: Patent anterior communicating artery and right posterior communicating artery. No left posterior communicating artery identified, likely hypoplastic or absent. Delayed phase: No abnormal  intracranial enhancement. Review of the MIP images confirms the above findings IMPRESSION: CTA neck: 1. Patent carotid and vertebral arteries. No dissection, aneurysm, or significant stenosis by NASCET criteria. 2. 20 mm nodule in right lobe of thyroid. Thyroid ultrasound is recommended on a nonemergent basis. CTA head: 1. Right PICA origin high-grade stenosis. 2. Otherwise patent anterior and posterior intracranial circulation. No additional high-grade stenosis, large vessel occlusion, or aneurysm. 3. No abnormal enhancement of the brain. Electronically Signed   By: Kristine Garbe M.D.   On: 01/31/2018 03:34   Ct Head Wo Contrast  Result Date: 01/30/2018 CLINICAL DATA:  Dizziness and nausea. Persistent gait disturbance. History of lymphoma. EXAM: CT HEAD WITHOUT CONTRAST TECHNIQUE: Contiguous axial images were obtained from the base of the skull through the vertex without intravenous contrast. COMPARISON:  None. FINDINGS: Brain: The ventricles are normal in size and configuration. There is no mass, hemorrhage, extra-axial fluid collection, or midline shift. There is decreased attenuation in the right mid cerebellum along the lateral aspect the right dentate nucleus compared to the left side. A focal developing infarct in the right mid cerebellum must be of concern given this appearance. Elsewhere gray-white compartments appear normal. Vascular: No hyperdense vessel evident. There is calcification in each carotid siphon. Skull: The bony calvarium appears intact. Sinuses/Orbits: There is mucosal thickening in several ethmoid air cells. Other visualized paranasal sinuses are clear. Orbits appear symmetric bilaterally. Other: Mastoid air cells are clear. IMPRESSION: Focal decreased attenuation in the mid right cerebellum in the lateral right dentate nucleus region compared to the left side. Early infarct in this area must be of concern. Elsewhere gray-white compartments appear normal. No mass or  hemorrhage. There are foci of arterial vascular calcification. There is mucosal thickening in several ethmoid air cells. Electronically Signed   By: Lowella Grip III M.D.   On: 01/30/2018 15:27   Ct Angio Neck W Or Wo Contrast  Result Date: 01/31/2018 CLINICAL DATA:  62 y/o M; possible right cerebellar stroke. History of T-cell lymphoma post chemotherapy. EXAM: CT ANGIOGRAPHY HEAD AND NECK TECHNIQUE: Multidetector CT imaging of the head and neck was performed using the standard protocol during bolus administration of intravenous contrast. Multiplanar CT image reconstructions and MIPs were obtained to evaluate the vascular anatomy. Carotid stenosis measurements (when applicable) are obtained utilizing NASCET criteria, using the distal internal carotid diameter as the denominator. CONTRAST:  26mL ISOVUE-370 IOPAMIDOL (ISOVUE-370) INJECTION 76% COMPARISON:  01/30/2018 CT head FINDINGS: CTA NECK FINDINGS Aortic arch: Standard branching. Imaged portion shows no evidence of aneurysm or dissection. No significant stenosis of the major arch vessel origins. Right carotid system: No evidence of dissection, stenosis (50% or greater) or occlusion. Left carotid system: No evidence of dissection, stenosis (50% or greater) or occlusion. Vertebral arteries: Right dominant. No evidence of dissection, stenosis (50% or greater) or occlusion. Skeleton: Cervical spondylosis with moderate discogenic degenerative changes from C5 through T1 and advanced right-sided facet arthropathy. Right C3-4 uncovertebral and facet hypertrophy results in severe bony foraminal stenosis. Other neck: Right lobe of thyroid 20 mm nodule. Upper chest: Negative. Review of the MIP images confirms the above findings CTA HEAD FINDINGS Anterior circulation:  No significant stenosis, proximal occlusion, aneurysm, or vascular malformation. Posterior circulation: Right PICA origin high-grade stenosis (series 7, image 138). Patent vertebral arteries, basilar  artery, and bilateral PCA without high-grade stenosis, aneurysm, occlusion, or vascular malformation. Lower basilar fenestration. Venous sinuses: As permitted by contrast timing, patent. Anatomic variants: Patent anterior communicating artery and right posterior communicating artery. No left posterior communicating artery identified, likely hypoplastic or absent. Delayed phase: No abnormal intracranial enhancement. Review of the MIP images confirms the above findings IMPRESSION: CTA neck: 1. Patent carotid and vertebral arteries. No dissection, aneurysm, or significant stenosis by NASCET criteria. 2. 20 mm nodule in right lobe of thyroid. Thyroid ultrasound is recommended on a nonemergent basis. CTA head: 1. Right PICA origin high-grade stenosis. 2. Otherwise patent anterior and posterior intracranial circulation. No additional high-grade stenosis, large vessel occlusion, or aneurysm. 3. No abnormal enhancement of the brain. Electronically Signed   By: Kristine Garbe M.D.   On: 01/31/2018 03:34     Scheduled Meds: .  stroke: mapping our early stages of recovery book   Does not apply Once  . aspirin  300 mg Rectal Daily   Or  . aspirin  325 mg Oral Daily  . folic acid  1 mg Oral Daily   Continuous Infusions:   LOS: 1 day   Time Spent in minutes   30 minutes  Liane Tribbey D.O. on 01/31/2018 at 11:08 AM  Between 7am to 7pm - Pager - [phone]  After 7pm go to [email or web address] - password [email or web address]  And look for the night coverage person covering for me after hours  [facility] Office  [phone]

## 2018-01-31 NOTE — Consult Note (Signed)
Neurology Consultation  Reason for Consult: Abnormal CT scan Referring Physician: Dr. Shanon Brow  CC: Dizziness  History is obtained from: Patient  HPI: Scott Mathis is a 62 y.o. male past medical history of hypertension, currently on methotrexate for cutaneous T-cell lymphoma, presenting to the emergency room at [facility] for evaluation of dizziness and unsteady gait upon waking up on the morning of 01/30/2018. He said he went to bed normally the night prior and upon waking up felt dizzy-as an lightheaded as well as room spinning.  When he tried to walk, he felt like he was walking on a ship like a drunken sailor. Denies any preceding neck injury.  Denies any chiropractic maneuvers.  Denies any head injury.  Denies any similar symptoms in the past.  Denies any preceding sicknesses or illnesses.  Denies any drug use or alcohol use.  Denies any headache or visual symptoms including diplopia or blurred vision.  Denies focal tingling numbness or weakness.  Denies slurred speech or aphasia.  Denies chest pain, shortness of breath, cough, fevers, chills.  He does report nausea but no vomiting.  LKW: 10 PM on 01/29/2018 when he went to bed tpa given?: no, outside the window Premorbid modified Rankin scale (mRS): 0  ROS: ROS was performed and is negative except as noted in the HPI.  Past Medical History:  Diagnosis Date  . Hypertension   . Lymphoma ([place])    FAMILY history Mother had stroke in her 75s. No h/o strokes at early age in family.  Social History:   reports that  has never smoked. He does not have any smokeless tobacco history on file. He reports that he does not drink alcohol or use drugs.  Medications  Current Facility-Administered Medications:  .   stroke: mapping our early stages of recovery book, , Does not apply, Once, Derrill Kay A, MD .  acetaminophen (TYLENOL) tablet 650 mg, 650 mg, Oral, Q4H PRN **OR** acetaminophen (TYLENOL) solution 650 mg, 650 mg, Per Tube, Q4H  PRN **OR** acetaminophen (TYLENOL) suppository 650 mg, 650 mg, Rectal, Q4H PRN, Derrill Kay A, MD .  aspirin suppository 300 mg, 300 mg, Rectal, Daily **OR** aspirin tablet 325 mg, 325 mg, Oral, Daily, Phillips Grout, MD  Exam: Current vital signs: BP 138/72 (BP Location: Left Arm)   Pulse (!) 59   Temp 97.8 F (36.6 C) (Oral)   Resp 16   Ht 5\' 8"  (1.727 m)   Wt 97.5 kg (215 lb)   SpO2 97%   BMI 32.69 kg/m   Vital signs in last 24 hours: Temp:  [97.8 F (36.6 C)-98.3 F (36.8 C)] 97.8 F (36.6 C) (02/10 0000) Pulse Rate:  [59-73] 59 (02/10 0000) Resp:  [13-19] 16 (02/10 0000) BP: (133-160)/(72-91) 138/72 (02/10 0000) SpO2:  [95 %-99 %] 97 % (02/10 0000) Weight:  [97.5 kg (215 lb)] 97.5 kg (215 lb) (02/09 1142) GENERAL: Awake, alert in NAD HEENT: - Normocephalic and atraumatic, dry mm, no LN++, no Thyromegally LUNGS - Clear to auscultation bilaterally with no wheezes CV - S1S2 RRR, no m/r/g, equal pulses bilaterally. ABDOMEN - Soft, nontender, nondistended with normoactive BS Ext: warm, well perfused, intact peripheral pulses, no edema NEURO:  Mental Status: AA&Ox3  Language: speech is not dysarthric.  Naming, repetition, fluency, and comprehension intact. Cranial Nerves: PERRL. EOMI, visual fields full, no facial asymmetry, facial sensation intact, hearing intact, tongue/uvula/soft palate midline, normal sternocleidomastoid and trapezius muscle strength. No evidence of tongue atrophy or fibrillations Motor: 5/5 all over  Tone: is normal and bulk is normal Sensation- Intact to light touch bilaterally Coordination: FTN intact bilaterally, no ataxia in BLE. Gait- deferred NIHSS - 0  Labs I have reviewed labs in epic and the results pertinent to this consultation are:  CBC    Component Value Date/Time   WBC 9.4 01/30/2018 1252   RBC 5.09 01/30/2018 1252   HGB 15.7 01/30/2018 1252   HCT 47.0 01/30/2018 1252   PLT 228 01/30/2018 1252   MCV 92.3 01/30/2018 1252   MCH  30.8 01/30/2018 1252   MCHC 33.4 01/30/2018 1252   RDW 14.2 01/30/2018 1252   CMP     Component Value Date/Time   NA 138 01/30/2018 1252   K 4.1 01/30/2018 1252   CL 101 01/30/2018 1252   CO2 28 01/30/2018 1252   GLUCOSE 121 (H) 01/30/2018 1252   BUN 17 01/30/2018 1252   CREATININE 0.81 01/30/2018 1252   CALCIUM 9.5 01/30/2018 1252   GFRNONAA >60 01/30/2018 1252   GFRAA >60 01/30/2018 1252   Imaging I have reviewed the images obtained: CT-scan of the brain focal decreased attenuation in the right cerebellum.   Assessment:  62 year old with past history of hypertension, currently on methotrexate for cutaneous T-cell lymphoma, presented to emergency room for evaluation of dizziness and unsteady gait upon waking up on the morning of 01/30/2018. Noncontrast CT of the head showed a possible area of decreased attenuation in the right cerebellum. Likely explanation is a cerebellar stroke. But given the history of chemotherapy and T-cell lymphoma, I would recommend obtaining brain imaging with contrast to further evaluate for mass versus stroke. His clinical exam is reassuring and his NIH stroke scale 0.  Recommendations: -Telemetry monitoring -Allow for permissive hypertension for the first 24-48h - only treat PRN if SBP >220 mmHg. Blood pressures can be gradually normalized to SBP<140 upon discharge. -MRI brain with and without contrast given h/o cutaneous T-cell lymphoma to evaluate for CNS involvement -CT Angiogram of Head and neck to evaluate head/neck vasculature -Echocardiogram -HgbA1c, fasting lipid panel -Frequent neuro checks -Prophylactic therapy-Antiplatelet med: Aspirin - dose 325mg  PO or 300mg  PR -Atorvastatin 80 mg PO daily -Risk factor modification -PT consult, OT consult, Speech consult Please page stroke NP/PA/MD (listed on AMION)  from 8am-4 pm as this patient will be followed by the stroke team at this point. -- Amie Portland, MD [facility] Pager:  [phone] If 7pm to 7am, please call on call as listed on AMION.

## 2018-01-31 NOTE — Progress Notes (Signed)
STROKE TEAM PROGRESS NOTE   HISTORY OF PRESENT ILLNESS (per record) Scott Mathis is a 62 y.o. male past medical history of hypertension and currently on methotrexate for cutaneous T-cell lymphoma, presenting to the emergency room at [facility] for evaluation of dizziness and unsteady gait upon waking up on the morning of 01/30/2018. He said he went to bed normally the night prior and upon waking up felt dizzy-as an lightheaded as well as room spinning.  When he tried to walk, he felt like he was walking on a ship like a drunken sailor. Denies any preceding neck injury.  Denies any chiropractic maneuvers.  Denies any head injury.  Denies any similar symptoms in the past.  Denies any preceding sicknesses or illnesses.  Denies any drug use or alcohol use.  Denies any headache or visual symptoms including diplopia or blurred vision.  Denies focal tingling numbness or weakness.  Denies slurred speech or aphasia.  Denies chest pain, shortness of breath, cough, fevers, chills.  He does report nausea but no vomiting.  LKW: 10 PM on 01/29/2018 when he went to bed tpa given?: no, outside the window Premorbid modified Rankin scale (mRS): 0   SUBJECTIVE (INTERVAL HISTORY) The patient's wife is at the bedside. The patient does not feel that he is quite back to baseline. Dr. Leonie Man discussed the possibility that the patient's deficits were caused by a TIA. An MRI is currently pending.   OBJECTIVE Temp:  [97.6 F (36.4 C)-98.3 F (36.8 C)] 97.6 F (36.4 C) (02/10 0314) Pulse Rate:  [59-73] 64 (02/10 0314) Cardiac Rhythm: Sinus bradycardia (02/10 0030) Resp:  [13-19] 15 (02/10 0314) BP: (130-160)/(72-91) 130/79 (02/10 0314) SpO2:  [95 %-99 %] 97 % (02/10 0314) Weight:  [215 lb (97.5 kg)] 215 lb (97.5 kg) (02/09 1142)  CBC:  Recent Labs  Lab 01/30/18 1252  WBC 9.4  HGB 15.7  HCT 47.0  MCV 92.3  PLT 546    Basic Metabolic Panel:  Recent Labs  Lab 01/30/18 1252  NA 138  K 4.1  CL 101   CO2 28  GLUCOSE 121*  BUN 17  CREATININE 0.81  CALCIUM 9.5    Lipid Panel: No results found for: CHOL, TRIG, HDL, CHOLHDL, VLDL, LDLCALC HgbA1c: No results found for: HGBA1C Urine Drug Screen: No results found for: LABOPIA, COCAINSCRNUR, LABBENZ, AMPHETMU, THCU, LABBARB  Alcohol Level No results found for: ETH  IMAGING   Ct Angio Head W Or Wo Contrast 01/31/2018 IMPRESSION:  CTA neck:  1. Patent carotid and vertebral arteries. No dissection, aneurysm, or significant stenosis by NASCET criteria.  2. 20 mm nodule in right lobe of thyroid. Thyroid ultrasound is recommended on a nonemergent basis.   CTA head:  1. Right PICA origin high-grade stenosis.  2. Otherwise patent anterior and posterior intracranial circulation. No additional high-grade stenosis, large vessel occlusion, or aneurysm.  3. No abnormal enhancement of the brain.    Ct Head Wo Contrast 01/30/2018 IMPRESSION:  Focal decreased attenuation in the mid right cerebellum in the lateral right dentate nucleus region compared to the left side. Early infarct in this area must be of concern. Elsewhere gray-white compartments appear normal. No mass or hemorrhage. There are foci of arterial vascular calcification. There is mucosal thickening in several ethmoid air cells.    MR Brain W & Wo Contrast - pending 01/30/2018   Transthoracic Echocardiogram - pending 00/00/00     PHYSICAL EXAM Vitals:   01/30/18 2300 01/30/18 2307 01/31/18 0000 01/31/18 2703  BP: 133/81  138/72 130/79  Pulse: 61  (!) 59 64  Resp: 13  16 15   Temp:  98.3 F (36.8 C) 97.8 F (36.6 C) 97.6 F (36.4 C)  TempSrc:  Oral Oral Oral  SpO2: 98%  97% 97%  Weight:      Height:      Pleasant healthy looking middle-aged Caucasian male currently not in distress. . Afebrile. Head is nontraumatic. Neck is supple without bruit.    Cardiac exam no murmur or gallop. Lungs are clear to auscultation. Distal pulses are well felt. Neurological Exam ;  Awake   Alert oriented x 3. Normal speech and language.eye movements full without nystagmus.fundi were not visualized. Vision acuity and fields appear normal. Hearing is normal. Palatal movements are normal. Face symmetric. Tongue midline. Normal strength, tone, reflexes and coordination. Normal sensation. Gait steady but unable to do tandem walking without difficulty HOME MEDICATIONS:  Medications Prior to Admission  Medication Sig Dispense Refill  . amLODipine (NORVASC) 5 MG tablet Take 5 mg by mouth daily.  0  . aspirin 81 MG EC tablet Take 81 mg by mouth daily. Swallow whole.    . folic acid (FOLVITE) 1 MG tablet Take 1 mg by mouth daily. Daily for 6 days. Methotrexate on the 7th day. Repeat  1  . methotrexate (RHEUMATREX) 2.5 MG tablet Take 2.5 mg by mouth every 7 (seven) days. Takes Folic Acid on the other 6 days.  0  . Multiple Vitamins-Minerals (MULTIVITAMIN MEN 50+) TABS Take 1 tablet by mouth daily.    . Omega-3 Fatty Acids (FISH OIL) 1200 MG CAPS Take 1 capsule by mouth daily.        HOSPITAL MEDICATIONS:  .  stroke: mapping our early stages of recovery book   Does not apply Once  . aspirin  300 mg Rectal Daily   Or  . aspirin  325 mg Oral Daily    ASSESSMENT/PLAN [name] is a 62 y.o. male with history of hypertension and lymphoma presenting with dizziness and unsteady gait. He did not receive IV t-PA due to late presentation.   Stroke:  Possible mid right cerebellum infarct - likely embolic - source unknown - MRI pending.  Resultant  Mild ataxia  CT head - Focal decreased attenuation in the mid right cerebellum in the lateral right dentate nucleus region  MRI head - pending  MRA head - not performed  CTA H&N -  Right PICA origin high-grade stenosis.   Carotid Doppler - pending  2D Echo - pending  LDL - pending  HgbA1c - pending  VTE prophylaxis - SCDs Fall precautions Diet Heart Room service appropriate? Yes; Fluid consistency: Thin  aspirin 81 mg daily  prior to admission, now on aspirin 325 mg daily  Patient counseled to be compliant with his antithrombotic medications  Ongoing aggressive stroke risk factor management  Therapy recommendations:  No OT follow-up recommended. PT evaluation pending.  Disposition:  Pending  Hypertension  Stable  Permissive hypertension (OK if < 220/120) but gradually normalize in 5-7 days  Long-term BP goal normotensive  Hyperlipidemia  Home meds:  No lipid lowering medications prior to admission  LDL pending, goal < 70  Add statin for LDL greater than 70.  Continue statin at discharge   Other Stroke Risk Factors  Obesity, Body mass index is 32.69 kg/m., recommend weight loss, diet and exercise as appropriate   Other Active Problems  20 mm nodule in right lobe of thyroid. Thyroid ultrasound is recommended  on a nonemergent basis.   Right PICA origin high-grade stenosis.   Plan / Recommendations   Stroke workup - MRI, echo, lipids, and hemoglobin A1c pending. Physical therapy evaluation pending.  Consider dual antiplatelet therapy for right PICA stenosis. Will discuss with Dr Leonie Man.   Hospital day # 1   Scott Lindon PA-C [facility] Pager [phone] 01/31/2018, 12:34 PM I have personally examined this patient, reviewed notes, independently viewed imaging studies, participated in medical decision making and plan of care.ROS completed by me personally and pertinent positives fully documented  I have made any additions or clarifications directly to the above note. Agree with note above. He presented with sudden onset of dizziness and gait ataxia likely due to small cerebellar stroke versus postnasal circulation TIA and workup is pending. Recommend antiplatelet therapy for now and continue ongoing workup. Long discussion with the patient and wife about TIA and stroke in discussion about prevention and treatment options and answered questions. Greater than 50% time during  this 35 minute visit was spent on counseling and coordination of care about TIA and stroke and answered questions Antony Contras, MD Medical Director [place] Pager: [phone] 01/31/2018 12:39 PM   To contact Stroke Continuity provider, please refer to [email or web address] After hours, contact General Neurology

## 2018-01-31 NOTE — Evaluation (Signed)
Physical Therapy Evaluation Patient Details Name: Scott Mathis MRN: [id number] DOB: 13-Sep-1956 Today's Date: 01/31/2018   History of Present Illness  62 y.o. male past medical history of hypertension, currently on methotrexate for cutaneous T-cell lymphoma, presenting to the emergency room at [facility] for evaluation of dizziness and unsteady gait upon waking up on the morning of 01/30/2018. MRI negative.     Clinical Impression  Pt admitted with above diagnosis. Pt currently with functional limitations due to the deficits listed below (see PT Problem List). PTA, pt independent with all functional mobility. On eval, he required supervision for transfers and ambulation 300 feet without AD. He demonstrates higher level balance deficits. Pt will benefit from skilled PT to increase their independence and safety with mobility to allow discharge to the venue listed below.       Follow Up Recommendations Outpatient PT    Equipment Recommendations  None recommended by PT    Recommendations for Other Services       Precautions / Restrictions Precautions Precautions: None      Mobility  Bed Mobility Overal bed mobility: Independent                Transfers Overall transfer level: Needs assistance Equipment used: None Transfers: Sit to/from Stand;Stand Pivot Transfers Sit to Stand: Supervision Stand pivot transfers: Supervision       General transfer comment: supervision for safety, increased time to stabilize initial standing balance  Ambulation/Gait Ambulation/Gait assistance: Supervision Ambulation Distance (Feet): 300 Feet Assistive device: None Gait Pattern/deviations: Step-through pattern Gait velocity: WFL Gait velocity interpretation: at or above normal speed for age/gender General Gait Details: "stiff leg" gait with minimal arm swing   Stairs Stairs: Yes Stairs assistance: Supervision Stair Management: Alternating pattern;Two rails;Forwards Number  of Stairs: 5    Wheelchair Mobility    Modified Rankin (Stroke Patients Only)       Balance Overall balance assessment: Needs assistance Sitting-balance support: No upper extremity supported;Feet supported Sitting balance-Leahy Scale: Normal     Standing balance support: No upper extremity supported;During functional activity Standing balance-Leahy Scale: Good               High level balance activites: Head turns;Direction changes;Turns High Level Balance Comments: LOB noted on stairs but able to self correct with support of hand rail.  Standardized Balance Assessment Standardized Balance Assessment : Dynamic Gait Index   Dynamic Gait Index Level Surface: Normal Change in Gait Speed: Normal Gait with Horizontal Head Turns: Normal Gait with Vertical Head Turns: Normal Gait and Pivot Turn: Normal Step Over Obstacle: Mild Impairment Step Around Obstacles: Normal Steps: Mild Impairment Total Score: 22       Pertinent Vitals/Pain Pain Assessment: No/denies pain    Home Living Family/patient expects to be discharged to:: Private residence Living Arrangements: Spouse/significant other Available Help at Discharge: Family Type of Home: House Home Access: Stairs to enter Entrance Stairs-Rails: Right;Left;Can reach both Technical brewer of Steps: 3 Home Layout: One level Home Equipment: None      Prior Function Level of Independence: Independent         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     Hand Dominance   Dominant Hand: Right    Extremity/Trunk Assessment   Upper Extremity Assessment Upper Extremity Assessment: Defer to OT evaluation    Lower Extremity Assessment Lower Extremity Assessment: Overall WFL for tasks assessed    Cervical / Trunk Assessment Cervical / Trunk Assessment: Normal  Communication   Communication: No difficulties  Cognition Arousal/Alertness: Awake/alert Behavior During Therapy: WFL for tasks  assessed/performed Overall Cognitive Status: Within Functional Limits for tasks assessed                                        General Comments      Exercises     Assessment/Plan    PT Assessment Patient needs continued PT services  PT Problem List Decreased mobility;Decreased coordination;Decreased balance       PT Treatment Interventions Therapeutic activities;Gait training;Therapeutic exercise;Patient/family education;Stair training;Balance training;Functional mobility training    PT Goals (Current goals can be found in the Care Plan section)  Acute Rehab PT Goals Patient Stated Goal: home PT Goal Formulation: With patient/family Time For Goal Achievement: 02/14/18 Potential to Achieve Goals: Good    Frequency Min 3X/week   Barriers to discharge        Co-evaluation               AM-PAC PT "6 Clicks" Daily Activity  Outcome Measure Difficulty turning over in bed (including adjusting bedclothes, sheets and blankets)?: None Difficulty moving from lying on back to sitting on the side of the bed? : None Difficulty sitting down on and standing up from a chair with arms (e.g., wheelchair, bedside commode, etc,.)?: None Help needed moving to and from a bed to chair (including a wheelchair)?: None Help needed walking in hospital room?: None Help needed climbing 3-5 steps with a railing? : A Little 6 Click Score: 23    End of Session Equipment Utilized During Treatment: Gait belt Activity Tolerance: Patient tolerated treatment well Patient left: in bed;with call bell/phone within reach;with family/visitor present Nurse Communication: Mobility status PT Visit Diagnosis: Unsteadiness on feet (R26.81)    Time: 1341-1355 PT Time Calculation (min) (ACUTE ONLY): 14 min   Charges:   PT Evaluation $PT Eval Low Complexity: 1 Low     PT G Codes:        Lorrin Goodell, PT  Office # [phone] Pager [phone]   Lorriane Shire 01/31/2018, 2:55  PM

## 2018-01-31 NOTE — Plan of Care (Signed)
  Ischemic Stroke/TIA Tissue Perfusion: Complications of ischemic stroke/TIA will be minimized 01/31/2018 0034 - Progressing by Mikey College, RN   Coping: Will identify appropriate support needs 01/31/2018 0034 - Progressing by Mikey College, RN   Education: Knowledge of disease or condition will improve 01/31/2018 0034 - Progressing by Mikey College, RN   Safety: Ability to remain free from injury will improve 01/31/2018 0034 - Progressing by Mikey College, RN

## 2018-02-01 ENCOUNTER — Encounter (HOSPITAL_COMMUNITY): Payer: Self-pay

## 2018-02-01 ENCOUNTER — Observation Stay (HOSPITAL_BASED_OUTPATIENT_CLINIC_OR_DEPARTMENT_OTHER): Payer: 59

## 2018-02-01 DIAGNOSIS — I6300 Cerebral infarction due to thrombosis of unspecified precerebral artery: Secondary | ICD-10-CM | POA: Diagnosis not present

## 2018-02-01 DIAGNOSIS — C8590 Non-Hodgkin lymphoma, unspecified, unspecified site: Secondary | ICD-10-CM | POA: Diagnosis not present

## 2018-02-01 DIAGNOSIS — I639 Cerebral infarction, unspecified: Secondary | ICD-10-CM | POA: Diagnosis not present

## 2018-02-01 DIAGNOSIS — I10 Essential (primary) hypertension: Secondary | ICD-10-CM

## 2018-02-01 DIAGNOSIS — R9089 Other abnormal findings on diagnostic imaging of central nervous system: Secondary | ICD-10-CM | POA: Diagnosis not present

## 2018-02-01 LAB — LIPID PANEL
CHOLESTEROL: 149 mg/dL (ref 0–200)
HDL: 43 mg/dL (ref >40)
LDL Cholesterol: 90 mg/dL (ref 0–99)
TRIGLYCERIDES: 81 mg/dL (ref <150)
Total CHOL/HDL Ratio: 3.5 RATIO
VLDL: 16 mg/dL (ref 0–40)

## 2018-02-01 LAB — HIV ANTIBODY (ROUTINE TESTING W REFLEX): HIV SCREEN 4TH GENERATION: NONREACTIVE

## 2018-02-01 LAB — HEMOGLOBIN A1C
Hgb A1c MFr Bld: 5.2 % (ref 4.8–5.6)
Mean Plasma Glucose: 102.54 mg/dL

## 2018-02-01 LAB — ECHOCARDIOGRAM COMPLETE
Height: 68 in
Weight: 3440 oz

## 2018-02-01 MED ORDER — ASPIRIN 325 MG PO TABS: 325.0000 mg | ORAL_TABLET | Freq: Every day | ORAL | 0 refills | Status: DC

## 2018-02-01 MED ORDER — ATORVASTATIN CALCIUM 20 MG PO TABS: 20.0000 mg | ORAL_TABLET | Freq: Every day | ORAL | 0 refills | Status: DC

## 2018-02-01 MED ORDER — ATORVASTATIN CALCIUM 10 MG PO TABS: 20.0000 mg | ORAL_TABLET | Freq: Every day | ORAL | Status: DC

## 2018-02-01 NOTE — Progress Notes (Signed)
Physical Therapy Treatment Patient Details Name: Scott Mathis MRN: [id number] DOB: 06-30-1956 Today's Date: 02/01/2018    History of Present Illness 62 y.o. male past medical history of hypertension, currently on methotrexate for cutaneous T-cell lymphoma, presenting to the emergency room at [facility] for evaluation of dizziness and unsteady gait upon waking up on the morning of 01/30/2018. MRI negative.     PT Comments    Pt progressing well towards all goals. Pt functioning near baseline. Pt reports feeling off first thing in the morning but then improving as the day goes one. Pt with mild balance deficits as noted by score of 22 on DGI. Pt with good home set up and support. Re-educated on BEFAST. Acute PT to con't to follow.   Follow Up Recommendations  Outpatient PT(if patient feels he is still off balance when he is discharg)     Equipment Recommendations  None recommended by PT    Recommendations for Other Services       Precautions / Restrictions Precautions Precautions: None Restrictions Weight Bearing Restrictions: No    Mobility  Bed Mobility Overal bed mobility: Independent                Transfers Overall transfer level: Modified independent Equipment used: None Transfers: Sit to/from Stand Sit to Stand: Modified independent (Device/Increase time)         General transfer comment: pt able to stand without LOB or instability. Pt stated "the first time I got up this morning after laying in bed I felt like a sailor on a boat but now I"m good"  Ambulation/Gait Ambulation/Gait assistance: Supervision Ambulation Distance (Feet): 1000 Feet Assistive device: None Gait Pattern/deviations: WFL(Within Functional Limits) Gait velocity: wfl Gait velocity interpretation: at or above normal speed for age/gender General Gait Details: pt ambulating near baseline. pt reports mild feeling of on a boat when doing figure 8 around obstacles   Stairs Stairs:  Yes   Stair Management: One rail Right;Alternating pattern;Forwards Number of Stairs: 10 General stair comments: pt reports "typically I wouldn't use the railing" pt with no instability though  Wheelchair Mobility    Modified Rankin (Stroke Patients Only) Modified Rankin (Stroke Patients Only) Pre-Morbid Rankin Score: No symptoms Modified Rankin: No significant disability     Balance                               High Level Balance Comments: pt given moderate to maximum pertebations in all directions at shoulder, hip, and trunk height. Pt able to regain balance and demo'd approriate safety reactions Standardized Balance Assessment Standardized Balance Assessment : Dynamic Gait Index   Dynamic Gait Index Level Surface: Normal Change in Gait Speed: Normal Gait with Horizontal Head Turns: Normal Gait with Vertical Head Turns: Normal Gait and Pivot Turn: Normal Step Over Obstacle: Mild Impairment Step Around Obstacles: Normal Steps: Mild Impairment Total Score: 22      Cognition Arousal/Alertness: Awake/alert Behavior During Therapy: WFL for tasks assessed/performed Overall Cognitive Status: Within Functional Limits for tasks assessed                                        Exercises      General Comments        Pertinent Vitals/Pain Pain Assessment: No/denies pain    Home Living  Prior Function            PT Goals (current goals can now be found in the care plan section) Progress towards PT goals: Progressing toward goals    Frequency    Min 3X/week      PT Plan Current plan remains appropriate    Co-evaluation              AM-PAC PT "6 Clicks" Daily Activity  Outcome Measure  Difficulty turning over in bed (including adjusting bedclothes, sheets and blankets)?: None Difficulty moving from lying on back to sitting on the side of the bed? : None Difficulty sitting down on and  standing up from a chair with arms (e.g., wheelchair, bedside commode, etc,.)?: None Help needed moving to and from a bed to chair (including a wheelchair)?: None Help needed walking in hospital room?: None Help needed climbing 3-5 steps with a railing? : A Little 6 Click Score: 23    End of Session Equipment Utilized During Treatment: Gait belt Activity Tolerance: Patient tolerated treatment well Patient left: in bed;with call bell/phone within reach;with family/visitor present Nurse Communication: Mobility status PT Visit Diagnosis: Unsteadiness on feet (R26.81)     Time: 7078-6754 PT Time Calculation (min) (ACUTE ONLY): 22 min  Charges:  $Gait Training: 8-22 mins                    G Codes:       Kittie Plater, PT, DPT Pager #: [phone] Office #: [phone]    [place] 02/01/2018, 9:29 AM

## 2018-02-01 NOTE — Discharge Summary (Signed)
Physician Discharge Summary  Scott Pasch ZDG:[id number] DOB: 1956/05/18 DOA: 01/30/2018  PCP: Doree Albee, MD  Admit date: 01/30/2018 Discharge date: 02/01/2018  Time spent: 45 minutes  Recommendations for Outpatient Follow-up:  Patient will be discharged to home.  Patient will need to follow up with primary care provider within one week of discharge.  Follow up with Dr. Leonie Man, neurology, in 6 weeks. Patient should continue medications as prescribed.  Patient should follow a heart healthy diet.   Discharge Diagnoses:  Gait instability/dizziness, suspect TIA versus CVA T-cell lymphoma Essential hypertension  Discharge Condition: Stable   Diet recommendation: heart healthy  Filed Weights   01/30/18 1142  Weight: 97.5 kg (215 lb)    History of present illness:  On 01/30/2018 by Dr. 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 He said he went to bed normally the night prior and upon waking up felt dizzy-as an lightheaded as well as room spinning. When he tried to walk, he felt like he was walking on a ship like a drunken sailor. Denies any preceding neck injury. Denies any chiropractic maneuvers. Denies any head injury. Denies any similar symptoms in the past. Denies any preceding sicknesses or illnesses. Denies any drug use or alcohol use. Denies any headache or visual symptoms including diplopia or blurred vision. Denies focal tingling numbness or weakness. Denies slurred speech or aphasia. Denies chest pain, shortness of breath, cough, fevers, chills. He does report nausea but no vomiting.  Hospital Course:  Gait instability/dizziness, suspect TIA versus CVA -CT head showed focal decreased attenuation in the mid right cerebellum in  the lateral right dentate nucleus region compared to the left side.  Early infarct in this area must be of concern. -CTA head/neck: Right PICA origin high grade stenosis  -MRI brain  no acute finding including artifact. -LDL 90, hemoglobin A1c 5.2 -Echocardiogram EF 55-60%, G1DD, no RWMA. No significant valvular abnormalities -PT recommended outpatient PT -Continue aspirin (dose increased to 325mg ), statin -follow up with neurology, Dr. Leonie Man, in 6 weeks  T-cell lymphoma -Methotrexate held, continue upon discharge  Essential hypertension -Home medications held, allowing for permissive hypertension -May continue amlodipine on discharge  Procedures: Echocardiogram  Consultations: Neurology   Discharge Exam: Vitals:   02/01/18 0537 02/01/18 1024  BP: 135/79 (!) 143/79  Pulse: 64 81  Resp: 18 19  Temp: 97.6 F (36.4 C) 98.3 F (36.8 C)  SpO2: 96% 96%     General: Well developed, well nourished, NAD, appears stated age  HEENT: NCAT, mucous membranes moist.  Cardiovascular: S1 S2 auscultated, RRR, no murmur  Respiratory: Clear to auscultation bilaterally with equal chest rise  Abdomen: Soft, nontender, nondistended, + bowel sounds  Extremities: warm dry without cyanosis clubbing or edema  Neuro: AAOx3, nonfocal  Psych: Appropriate mood and affect  Discharge Instructions Discharge Instructions    Ambulatory referral to Neurology   Complete by:  As directed    An appointment is requested in approximately: 6 weeks Follow up with stroke clinic (Dr Leonie Man preferred, if not available, then consider [facility] Chestnut, [facility] or Jaynee Eagles whoever is available) at [facility] in about 6-8 weeks. Thanks.   Ambulatory referral to Physical Therapy   Complete by:  As directed    Discharge instructions   Complete by:  As directed  Patient will be discharged to home.  Patient will need to follow up with primary care provider within one week of discharge.  Follow up with Dr. Leonie Man,  neurology, in 6 weeks. Patient should continue medications as prescribed.  Patient should follow a heart healthy diet.     Allergies as of 02/01/2018   No Known Allergies     Medication List    STOP taking these medications   aspirin 81 MG EC tablet Replaced by:  aspirin 325 MG tablet     TAKE these medications   amLODipine 5 MG tablet Commonly known as:  NORVASC Take 5 mg by mouth daily.   aspirin 325 MG tablet Take 1 tablet (325 mg total) by mouth daily. Start taking on:  02/02/2018 Replaces:  aspirin 81 MG EC tablet   atorvastatin 20 MG tablet Commonly known as:  LIPITOR Take 1 tablet (20 mg total) by mouth daily at 6 PM.   Fish Oil 1200 MG Caps Take 1 capsule by mouth daily.   folic acid 1 MG tablet Commonly known as:  FOLVITE Take 1 mg by mouth daily. Daily for 6 days. Methotrexate on the 7th day. Repeat   methotrexate 2.5 MG tablet Commonly known as:  RHEUMATREX Take 2.5 mg by mouth every 7 (seven) days. Takes Folic Acid on the other 6 days.   MULTIVITAMIN MEN 50+ Tabs Take 1 tablet by mouth daily.      No Known Allergies Follow-up Information    Garvin Fila, MD. Schedule an appointment as soon as possible for a visit in 6 week(s).   Specialties:  Neurology, Radiology Contact information: [address] [place]  [place] [address] [phone]        Doree Albee, MD. Schedule an appointment as soon as possible for a visit in 1 week(s).   Specialty:  Internal Medicine Why:  Hospital follow up Contact information: [place] [address] [phone]        [facility] Follow [facility].   Specialty:  Rehabilitation Why:  They will contact you for the first appointment Contact information: [place] [phone]           The results of significant diagnostics from this hospitalization (including imaging,  microbiology, ancillary and laboratory) are listed below for reference.    Significant Diagnostic Studies: Ct Angio Head W Or Wo Contrast  Result Date: 01/31/2018 CLINICAL DATA:  62 y/o M; possible right cerebellar stroke. History of T-cell lymphoma post chemotherapy. EXAM: CT ANGIOGRAPHY HEAD AND NECK TECHNIQUE: Multidetector CT imaging of the head and neck was performed using the standard protocol during bolus administration of intravenous contrast. Multiplanar CT image reconstructions and MIPs were obtained to evaluate the vascular anatomy. Carotid stenosis measurements (when applicable) are obtained utilizing NASCET criteria, using the distal internal carotid diameter as the denominator. CONTRAST:  37mL ISOVUE-370 IOPAMIDOL (ISOVUE-370) INJECTION 76% COMPARISON:  01/30/2018 CT head FINDINGS: CTA NECK FINDINGS Aortic arch: Standard branching. Imaged portion shows no evidence of aneurysm or dissection. No significant stenosis of the major arch vessel origins. Right carotid system: No evidence of dissection, stenosis (50% or greater) or occlusion. Left carotid system: No evidence of dissection, stenosis (50% or greater) or occlusion. Vertebral arteries: Right dominant. No evidence of dissection, stenosis (50% or greater) or occlusion. Skeleton: Cervical spondylosis with moderate discogenic degenerative changes from C5 through T1 and advanced right-sided facet arthropathy. Right C3-4 uncovertebral and facet hypertrophy results  in severe bony foraminal stenosis. Other neck: Right lobe of thyroid 20 mm nodule. Upper chest: Negative. Review of the MIP images confirms the above findings CTA HEAD FINDINGS Anterior circulation: No significant stenosis, proximal occlusion, aneurysm, or vascular malformation. Posterior circulation: Right PICA origin high-grade stenosis (series 7, image 138). Patent vertebral arteries, basilar artery, and bilateral PCA without high-grade stenosis, aneurysm, occlusion, or vascular  malformation. Lower basilar fenestration. Venous sinuses: As permitted by contrast timing, patent. Anatomic variants: Patent anterior communicating artery and right posterior communicating artery. No left posterior communicating artery identified, likely hypoplastic or absent. Delayed phase: No abnormal intracranial enhancement. Review of the MIP images confirms the above findings IMPRESSION: CTA neck: 1. Patent carotid and vertebral arteries. No dissection, aneurysm, or significant stenosis by NASCET criteria. 2. 20 mm nodule in right lobe of thyroid. Thyroid ultrasound is recommended on a nonemergent basis. CTA head: 1. Right PICA origin high-grade stenosis. 2. Otherwise patent anterior and posterior intracranial circulation. No additional high-grade stenosis, large vessel occlusion, or aneurysm. 3. No abnormal enhancement of the brain. Electronically Signed   By: Kristine Garbe M.D.   On: 01/31/2018 03:34   Ct Head Wo Contrast  Result Date: 01/30/2018 CLINICAL DATA:  Dizziness and nausea. Persistent gait disturbance. History of lymphoma. EXAM: CT HEAD WITHOUT CONTRAST TECHNIQUE: Contiguous axial images were obtained from the base of the skull through the vertex without intravenous contrast. COMPARISON:  None. FINDINGS: Brain: The ventricles are normal in size and configuration. There is no mass, hemorrhage, extra-axial fluid collection, or midline shift. There is decreased attenuation in the right mid cerebellum along the lateral aspect the right dentate nucleus compared to the left side. A focal developing infarct in the right mid cerebellum must be of concern given this appearance. Elsewhere gray-white compartments appear normal. Vascular: No hyperdense vessel evident. There is calcification in each carotid siphon. Skull: The bony calvarium appears intact. Sinuses/Orbits: There is mucosal thickening in several ethmoid air cells. Other visualized paranasal sinuses are clear. Orbits appear symmetric  bilaterally. Other: Mastoid air cells are clear. IMPRESSION: Focal decreased attenuation in the mid right cerebellum in the lateral right dentate nucleus region compared to the left side. Early infarct in this area must be of concern. Elsewhere gray-white compartments appear normal. No mass or hemorrhage. There are foci of arterial vascular calcification. There is mucosal thickening in several ethmoid air cells. Electronically Signed   By: Lowella Grip III M.D.   On: 01/30/2018 15:27   Ct Angio Neck W Or Wo Contrast  Result Date: 01/31/2018 CLINICAL DATA:  63 y/o M; possible right cerebellar stroke. History of T-cell lymphoma post chemotherapy. EXAM: CT ANGIOGRAPHY HEAD AND NECK TECHNIQUE: Multidetector CT imaging of the head and neck was performed using the standard protocol during bolus administration of intravenous contrast. Multiplanar CT image reconstructions and MIPs were obtained to evaluate the vascular anatomy. Carotid stenosis measurements (when applicable) are obtained utilizing NASCET criteria, using the distal internal carotid diameter as the denominator. CONTRAST:  47mL ISOVUE-370 IOPAMIDOL (ISOVUE-370) INJECTION 76% COMPARISON:  01/30/2018 CT head FINDINGS: CTA NECK FINDINGS Aortic arch: Standard branching. Imaged portion shows no evidence of aneurysm or dissection. No significant stenosis of the major arch vessel origins. Right carotid system: No evidence of dissection, stenosis (50% or greater) or occlusion. Left carotid system: No evidence of dissection, stenosis (50% or greater) or occlusion. Vertebral arteries: Right dominant. No evidence of dissection, stenosis (50% or greater) or occlusion. Skeleton: Cervical spondylosis with moderate discogenic degenerative changes from C5  through T1 and advanced right-sided facet arthropathy. Right C3-4 uncovertebral and facet hypertrophy results in severe bony foraminal stenosis. Other neck: Right lobe of thyroid 20 mm nodule. Upper chest: Negative.  Review of the MIP images confirms the above findings CTA HEAD FINDINGS Anterior circulation: No significant stenosis, proximal occlusion, aneurysm, or vascular malformation. Posterior circulation: Right PICA origin high-grade stenosis (series 7, image 138). Patent vertebral arteries, basilar artery, and bilateral PCA without high-grade stenosis, aneurysm, occlusion, or vascular malformation. Lower basilar fenestration. Venous sinuses: As permitted by contrast timing, patent. Anatomic variants: Patent anterior communicating artery and right posterior communicating artery. No left posterior communicating artery identified, likely hypoplastic or absent. Delayed phase: No abnormal intracranial enhancement. Review of the MIP images confirms the above findings IMPRESSION: CTA neck: 1. Patent carotid and vertebral arteries. No dissection, aneurysm, or significant stenosis by NASCET criteria. 2. 20 mm nodule in right lobe of thyroid. Thyroid ultrasound is recommended on a nonemergent basis. CTA head: 1. Right PICA origin high-grade stenosis. 2. Otherwise patent anterior and posterior intracranial circulation. No additional high-grade stenosis, large vessel occlusion, or aneurysm. 3. No abnormal enhancement of the brain. Electronically Signed   By: Kristine Garbe M.D.   On: 01/31/2018 03:34   Mr Jeri Cos FX Contrast  Result Date: 01/31/2018 CLINICAL DATA:  Stroke follow-up. Dizziness and unsteady gait. History of lymphoma EXAM: MRI HEAD WITHOUT AND WITH CONTRAST TECHNIQUE: Multiplanar, multiecho pulse sequences of the brain and surrounding structures were obtained without and with intravenous contrast. CONTRAST:  15mL MULTIHANCE GADOBENATE DIMEGLUMINE 529 MG/ML IV SOLN COMPARISON:  None. FINDINGS: Brain: No acute infarction, hemorrhage, hydrocephalus, extra-axial collection or mass lesion. Approximately 7 tiny FLAIR hyperintensities in the cerebral white matter attributed to nonspecific remote insult. These may  be related patient's history of hypertension. Normal brain volume. No abnormal intracranial enhancement. No inner ear asymmetry or abnormality noted. Vascular: Major flow voids and vascular enhancements are preserved. Skull and upper cervical spine: No evidence of marrow lesion. Sinuses/Orbits: Mild mucosal thickening on the floor of the left maxillary sinus. IMPRESSION: 1. No acute finding, including infarct. 2. Few small signal abnormalities in the cerebral white matter which may be related patient's history of hypertension, but are nonspecific. Electronically Signed   By: Monte Fantasia M.D.   On: 01/31/2018 13:01    Microbiology: No results found for this or any previous visit (from the past 240 hour(s)).   Labs: Basic Metabolic Panel: Recent Labs  Lab 01/30/18 1252  NA 138  K 4.1  CL 101  CO2 28  GLUCOSE 121*  BUN 17  CREATININE 0.81  CALCIUM 9.5   Liver Function Tests: No results for input(s): AST, ALT, ALKPHOS, BILITOT, PROT, ALBUMIN in the last 168 hours. No results for input(s): LIPASE, AMYLASE in the last 168 hours. No results for input(s): AMMONIA in the last 168 hours. CBC: Recent Labs  Lab 01/30/18 1252  WBC 9.4  HGB 15.7  HCT 47.0  MCV 92.3  PLT 228   Cardiac Enzymes: No results for input(s): CKTOTAL, CKMB, CKMBINDEX, TROPONINI in the last 168 hours. BNP: BNP (last 3 results) No results for input(s): BNP in the last 8760 hours.  ProBNP (last 3 results) No results for input(s): PROBNP in the last 8760 hours.  CBG: Recent Labs  Lab 01/30/18 1148  GLUCAP 130*       Signed:  Karley Pho  [facility] 02/01/2018, 4:16 PM

## 2018-02-01 NOTE — Care Management Note (Signed)
Case Management Note  Patient Details  Name: Scott Mathis MRN: [id number] Date of Birth: [birth year]  Subjective/Objective:       Pt in to r/o CVA. He is from home with his spouse.           PCP: Doree Albee  Insurance: [employer]   Action/Plan: CM consulted for outpatient PT. Pt would like to attend [employer]. Orders in Epic and information on the AVS. Wife to provide transportation home.   Expected Discharge Date:  02/01/18               Expected Discharge Plan:  Home/Self Care  In-House Referral:     Discharge planning Services     Post Acute Care Choice:    Choice offered to:     DME Arranged:    DME Agency:     HH Arranged:    HH Agency:     Status of Service:  Completed, signed off  If discussed at [employer] of Stay Meetings, dates discussed:    Additional Comments:  Pollie Friar, RN 02/01/2018, 3:03 PM

## 2018-02-01 NOTE — Progress Notes (Signed)
Pt discharge education and instructions completed with pt and spouse at bedside; both voices understanding and denies any questions. Pt IV and telemetry removed; pt discharge home with spouse to transport him home. Pt to pick up electronically sent prescriptions from preferred pharmacy on file. Pt transported off unit via wheelchair with belongings and spouse at side. P. Amo Graciano Batson RN 

## 2018-02-01 NOTE — Progress Notes (Signed)
  Echocardiogram 2D Echocardiogram has been performed.  Darlina Sicilian M 02/01/2018, 12:10 PM

## 2018-02-01 NOTE — Progress Notes (Signed)
STROKE TEAM PROGRESS NOTE   HISTORY OF PRESENT ILLNESS (per record) Scott Mathis is a 62 y.o. 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 He said he went to bed normally the night prior and upon waking up felt dizzy-as an lightheaded as well as room spinning.  When he tried to walk, he felt like he was walking on a ship like a drunken sailor. Denies any preceding neck injury.  Denies any chiropractic maneuvers.  Denies any head injury.  Denies any similar symptoms in the past.  Denies any preceding sicknesses or illnesses.  Denies any drug use or alcohol use.  Denies any headache or visual symptoms including diplopia or blurred vision.  Denies focal tingling numbness or weakness.  Denies slurred speech or aphasia.  Denies chest pain, shortness of breath, cough, fevers, chills.  He does report nausea but no vomiting.  LKW: 10 PM on 01/29/2018 when he went to bed tpa given?: no, outside the window Premorbid modified Rankin scale (mRS): 0   SUBJECTIVE (INTERVAL HISTORY) The patient's wife is at the bedside. The patient recently returned from walking with PT. He feels much better, no ataxia today with walking. MRI negative for acute findings. Patient likely to be discharged home later today  OBJECTIVE Temp:  [97.6 F (36.4 C)-98.4 F (36.9 C)] 98.3 F (36.8 C) (02/11 1024) Pulse Rate:  [62-81] 81 (02/11 1024) Cardiac Rhythm: Normal sinus rhythm (02/11 0800) Resp:  [16-19] 19 (02/11 1024) BP: (117-143)/(60-82) 143/79 (02/11 1024) SpO2:  [96 %-98 %] 96 % (02/11 1024)  CBC:  Recent Labs  Lab 01/30/18 1252  WBC 9.4  HGB 15.7  HCT 47.0  MCV 92.3  PLT 478    Basic Metabolic Panel:  Recent Labs  Lab 01/30/18 1252  NA 138  K 4.1  CL 101  CO2 28  GLUCOSE 121*  BUN 17  CREATININE  0.81  CALCIUM 9.5    Lipid Panel:     Component Value Date/Time   CHOL 149 02/01/2018 0220   TRIG 81 02/01/2018 0220   HDL 43 02/01/2018 0220   CHOLHDL 3.5 02/01/2018 0220   VLDL 16 02/01/2018 0220   LDLCALC 90 02/01/2018 0220   HgbA1c:  Lab Results  Component Value Date   HGBA1C 5.2 02/01/2018   Urine Drug Screen: No results found for: LABOPIA, COCAINSCRNUR, LABBENZ, AMPHETMU, THCU, LABBARB  Alcohol Level No results found for: ETH  IMAGING   Ct Angio Head W Or Wo Contrast 01/31/2018 IMPRESSION:  CTA neck:  1. Patent carotid and vertebral arteries. No dissection, aneurysm, or significant stenosis by NASCET criteria.  2. 20 mm nodule in right lobe of thyroid. Thyroid ultrasound is recommended on a nonemergent basis.   CTA head:  1. Right PICA origin high-grade stenosis.  2. Otherwise patent anterior and posterior intracranial circulation. No additional high-grade stenosis, large vessel occlusion, or aneurysm.  3. No abnormal enhancement of the brain.    Ct Head Wo Contrast 01/30/2018 IMPRESSION:  Focal decreased attenuation in the mid right cerebellum in the lateral right dentate nucleus region compared to the left side. Early infarct in this area must be of concern. Elsewhere gray-white compartments appear normal. No mass or hemorrhage. There are foci of arterial vascular calcification. There is mucosal thickening in several ethmoid air cells.    MR Brain W &  Wo Contrast -  01/30/2018 IMPRESSION: 1. No acute finding, including infarct. 2. Few small signal abnormalities in the cerebral white matter which may be related patient's history of hypertension, but are nonspecific.  Transthoracic Echocardiogram - pending 00/00/00  PHYSICAL EXAM Vitals:   01/31/18 2100 02/01/18 0137 02/01/18 0537 02/01/18 1024  BP: 128/82 117/70 135/79 (!) 143/79  Pulse: 64 62 64 81  Resp: 18 18 18 19   Temp: 98.4 F (36.9 C) 97.7 F (36.5 C) 97.6 F (36.4 C) 98.3 F (36.8 C)   TempSrc: Oral Oral Oral Oral  SpO2: 97% 96% 96% 96%  Weight:      Height:      Pleasant healthy looking middle-aged Caucasian male currently not in distress. . Afebrile. Head is nontraumatic. Neck is supple without bruit.    Cardiac exam no murmur or gallop. Lungs are clear to auscultation. Distal pulses are well felt. Neurological Exam ;  Awake  Alert oriented x 3. Normal speech and language.eye movements full without nystagmus.fundi were not visualized. Vision acuity and fields appear normal. Hearing is normal. Palatal movements are normal. Face symmetric. Tongue midline. Normal strength, tone, reflexes and coordination. Normal sensation. Gait steady but unable to do tandem walking without difficulty  ASSESSMENT/PLAN [name] is a 62 y.o. male with history of hypertension and lymphoma presenting with dizziness and unsteady gait. He did not receive IV t-PA due to late presentation.   Stroke:  Possible mid right cerebellum infarct - likely embolic - source unknown - MRI pending.  Resultant  Mild ataxia  CT head - Focal decreased attenuation in the mid right cerebellum in the lateral right dentate nucleus region  MRI head - No acute findings  MRA head - not performed  CTA H&N -  Right PICA origin high-grade stenosis.   2D Echo - pending  LDL - 90  HgbA1c - 5.2  VTE prophylaxis - SCDs Fall precautions Diet Heart Room service appropriate? Yes; Fluid consistency: Thin  aspirin 81 mg daily prior to admission, now on aspirin 325 mg daily  Patient counseled to be compliant with his antithrombotic medications  Ongoing aggressive stroke risk factor management  Therapy recommendations:  HOME  Disposition:  HOME  Hypertension  Stable  Permissive hypertension (OK if < 220/120) but gradually normalize in 5-7 days  Long-term BP goal normotensive  Hyperlipidemia  Home meds:  No lipid lowering medications prior to admission  LDL 90, goal < 70  Lipitor 20 mg daily  added  Continue statin at discharge  Other Stroke Risk Factors  Obesity, Body mass index is 32.69 kg/m., recommend weight loss, diet and exercise as appropriate   Other Active Problems  20 mm nodule in right lobe of thyroid. Thyroid ultrasound is recommended on a nonemergent basis.   Right PICA origin high-grade stenosis.   Plan / Recommendations   Stroke workup - Completed, ECHO completed and results pending  Hospital day # 1   Renie Ora Neurology Stroke Team 02/01/2018, 12:21 PM  Attending note: I reviewed above note and agree with the assessment and plan. I have made any additions or clarifications directly to the above note. Pt was seen and examined.   62 year old male with history of hypertension, cutaneous lymphoma at home methotrexate admitted for dizziness on getting up from bed x 2 and a steady gait.  CT concerning for right cerebellar infarct.  CT head and neck showed right PICA origin stenosis.  However MRI no acute infarct.  EF 55-60%.  LDL  90 and A1c 5.2.  Patient currently symptom resolved.  Episode concerning for posterior circulation TIA versus BPPV.  Dix-Hallpike test negative today on rounding.  Patient on aspirin and Lipitor for stroke prevention.  Will recommend outpatient PT for BPPV maneuver training too.  Patient counseled on lifestyle changes and risk factor modification.  Neurology will sign off. Please call with questions. Pt will follow up with Dr. Leonie Man at [facility] in about 6 weeks. Thanks for the consult.   Rosalin Hawking, MD PhD Stroke Neurology 02/01/2018 10:46 PM     To contact Stroke Continuity provider, please refer to [email or web address] After hours, contact General Neurology

## 2018-02-01 NOTE — Progress Notes (Signed)
Occupational Therapy Treatment Patient Details Name: Scott Mathis MRN: [id number] DOB: 1956-03-23 Today's Date: 02/01/2018    History of present illness 62 y.o. male past medical history of hypertension, currently on methotrexate for cutaneous T-cell lymphoma, presenting to the emergency room at [facility] for evaluation of dizziness and unsteady gait upon waking up on the morning of 01/30/2018. MRI negative.    OT comments  Pt presents sitting up in recliner, agreeable to OT tx session. Pt appearing back to baseline regarding ADL completion. Pt completing standing grooming ADLs with supervision during session; completing room and hallway level functional mobility without AD and with higher balance challenges included (see below); Pt completing at overall supervision level. Pt reports feeling back to baseline regarding ADLs and functional mobility. Questions answered throughout; feel Pt is safe to return home from OT standpoint once medically ready; no further acute OT needs identified at this time. Will sign off.    Follow Up Recommendations  No OT follow up    Equipment Recommendations  None recommended by OT          Precautions / Restrictions Precautions Precautions: None Restrictions Weight Bearing Restrictions: No       Mobility Bed Mobility Overal bed mobility: Independent             General bed mobility comments: OOB in recliner upon arrival   Transfers Overall transfer level: Modified independent Equipment used: None Transfers: Sit to/from Stand Sit to Stand: Modified independent (Device/Increase time)         General transfer comment: pt able to stand from recliner without LOB or instability.     Balance Overall balance assessment: Needs assistance Sitting-balance support: No upper extremity supported;Feet supported Sitting balance-Leahy Scale: Normal     Standing balance support: No upper extremity supported;During functional  activity Standing balance-Leahy Scale: Good                 High Level Balance Comments: pt given moderate to maximum pertebations in all directions at shoulder, hip, and trunk height. Pt able to regain balance and demo'd approriate safety reactions Standardized Balance Assessment Standardized Balance Assessment : Dynamic Gait Index   Dynamic Gait Index Level Surface: Normal Change in Gait Speed: Normal Gait with Horizontal Head Turns: Normal Gait with Vertical Head Turns: Normal Gait and Pivot Turn: Normal Step Over Obstacle: Mild Impairment Step Around Obstacles: Normal Steps: Mild Impairment Total Score: 22     ADL either performed or assessed with clinical judgement   ADL Overall ADL's : At baseline     Grooming: Modified independent;Supervision/safety;Standing                                 General ADL Comments: Pt completing room and hallway level functional mobility; standing grooming ADLs at sink; Pt reports feeling back at baseline regarding ADL completion; higher level balance challenges completed during session including stepping over items, picking up items from floor, counting backwards while completing mobility, head turns, and walking tandem; Pt completing all with overall supervision and with minGuard during tandem stepping     Vision Patient Visual Report: No change from baseline Vision Assessment?: No apparent visual deficits              Cognition Arousal/Alertness: Awake/alert Behavior During Therapy: WFL for tasks assessed/performed Overall Cognitive Status: Within Functional Limits for tasks assessed  Pertinent Vitals/ Pain       Pain Assessment: No/denies pain                                                          Frequency  Min 2X/week        Progress Toward Goals  OT Goals(current goals can now be found  in the care plan section)  Progress towards OT goals: Goals met/education completed, patient discharged from OT  Acute Rehab OT Goals Patient Stated Goal: home OT Goal Formulation: All assessment and education complete, DC therapy  Plan Discharge plan remains appropriate                     AM-PAC PT "6 Clicks" Daily Activity     Outcome Measure   Help from another person eating meals?: None Help from another person taking care of personal grooming?: None Help from another person toileting, which includes using toliet, bedpan, or urinal?: None Help from another person bathing (including washing, rinsing, drying)?: None Help from another person to put on and taking off regular upper body clothing?: None Help from another person to put on and taking off regular lower body clothing?: None 6 Click Score: 24    End of Session Equipment Utilized During Treatment: Gait belt  OT Visit Diagnosis: Unsteadiness on feet (R26.81)   Activity Tolerance Patient tolerated treatment well   Patient Left with call bell/phone within reach;Other (comment);with family/visitor present(sitting EOB)   Nurse Communication Mobility status        Time: 2929-0903 OT Time Calculation (min): 13 min  Charges: OT General Charges $OT Visit: 1 Visit OT Treatments $Therapeutic Activity: 8-22 mins  Scott Mathis, [place] Pager [phone] 02/01/2018    Scott Mathis 02/01/2018, 12:34 PM

## 2018-02-01 NOTE — Progress Notes (Signed)
SLP Cancellation Note  Patient Details Name: Scott Mathis MRN: [id number] DOB: 1956-12-17   Cancelled treatment:       Reason Eval/Treat Not Completed: SLP screened chart, reviewed neuro/OT/PT notes, no needs identified, will sign off   Juan Quam Laurice 02/01/2018, 3:58 PM

## 2018-02-02 DIAGNOSIS — C8400 Mycosis fungoides, unspecified site: Secondary | ICD-10-CM | POA: Diagnosis not present

## 2018-02-10 DIAGNOSIS — R0683 Snoring: Secondary | ICD-10-CM | POA: Diagnosis not present

## 2018-02-10 DIAGNOSIS — G459 Transient cerebral ischemic attack, unspecified: Secondary | ICD-10-CM | POA: Diagnosis not present

## 2018-02-10 DIAGNOSIS — E049 Nontoxic goiter, unspecified: Secondary | ICD-10-CM | POA: Diagnosis not present

## 2018-02-15 ENCOUNTER — Other Ambulatory Visit (HOSPITAL_COMMUNITY): Payer: Self-pay | Admitting: Internal Medicine

## 2018-02-15 DIAGNOSIS — E049 Nontoxic goiter, unspecified: Secondary | ICD-10-CM

## 2018-02-19 ENCOUNTER — Ambulatory Visit: Payer: 59 | Attending: Internal Medicine | Admitting: Physical Therapy

## 2018-02-19 ENCOUNTER — Encounter: Payer: Self-pay | Admitting: Physical Therapy

## 2018-02-19 ENCOUNTER — Ambulatory Visit (HOSPITAL_COMMUNITY): Payer: 59

## 2018-02-19 DIAGNOSIS — R2681 Unsteadiness on feet: Secondary | ICD-10-CM | POA: Diagnosis present

## 2018-02-19 NOTE — Therapy (Signed)
[facility] [address] [place], [place], [address] Phone: [phone]   Fax:  [phone]  Physical Therapy Evaluation  Patient Details  Name: Scott Mathis MRN: [id number] Date of Birth: 1956-07-19 Referring Provider: Jacqualin Combes, RN ((Dr. Leonie Man in April))   Encounter Date: 02/19/2018  PT End of Session - 02/19/18 1548    Visit Number  1    Number of Visits  1    PT Start Time  9767    PT Stop Time  1133    PT Time Calculation (min)  28 min    Activity Tolerance  Patient tolerated treatment well    Behavior During Therapy  [facility] for tasks assessed/performed       Past Medical History:  Diagnosis Date  . Hypertension   . Lymphoma [facility])     Past Surgical History:  Procedure Laterality Date  . TONSILLECTOMY      There were no vitals filed for this visit.   Subjective Assessment - 02/19/18 1109    Subjective  Pt reports having TIA 01/30/18, in cerebellum, affecting balance and equilibrium.  Was very unsteady on my feet when I tried to get up to the bathroom.  Had several days of work-up at [facility], and by the time I've left the hospital, I haven't had any other problems.  Feel my balance is back to normal.    Pertinent History  lymphoma, HTN    Patient Stated Goals  No goals, feels he is back to normal    Currently in Pain?  Yes    Pain Score  3     Pain Location  Neck    Pain Orientation  Posterior    Pain Descriptors / Indicators  Aching    Pain Type  Chronic pain for years    Pain Onset  More than a month ago    Pain Frequency  Intermittent    Aggravating Factors   head turns    Pain Relieving Factors  changing positions         [facility] PT Assessment - 02/19/18 1114      Assessment   Medical Diagnosis  CVA/TIA    Referring Provider  Jacqualin Combes, RN (Dr. Leonie Man in April)    Onset Date/Surgical Date  01/30/18      Precautions   Precautions  None      Balance Screen   Has the  patient fallen in the past 6 months  No    Has the patient had a decrease in activity level because of a fear of falling?   No    Is the patient reluctant to leave their home because of a fear of falling?   No      Home Environment   Living Environment  Private residence    Living Arrangements  Spouse/significant other    Available Help at Discharge  Family    Type of [place] to enter    Entrance Stairs-Number of Steps  3    [place]  One level      Prior Function   Level of [place]  Works at home    [employer] work; has standing desk-uses 50% of the time      Observation/Other Assessments   Focus on Therapeutic Outcomes (FOTO)   NA-pt will not return  Posture/Postural Control   Posture/Postural Control  No significant limitations      ROM / Strength   AROM / PROM / Strength  Strength      Strength   Overall Strength  Within functional limits for tasks performed      Ambulation/Gait   Ambulation/Gait  Yes    Ambulation/Gait Assistance  7: Independent    Ambulation Distance (Feet)  250 Feet    Assistive device  None    Gait Pattern  Within Functional Limits    Ambulation Surface  Level;Indoor    Gait velocity  7.4 sec = 4.43 ft/sec      Standardized Balance Assessment   Standardized Balance Assessment  Timed Up and Go Test      Timed Up and Go Test   Normal TUG (seconds)  8.38      High Level Balance   High Level Balance Comments  Pt able to stand EO and EC on solid surface and on foam x 30 seconds, no LOB      Functional Gait  Assessment   Gait assessed   Yes    Gait Level Surface  Walks 20 ft in less than 5.5 sec, no assistive devices, good speed, no evidence for imbalance, normal gait pattern, deviates no more than 6 in outside of the 12 in walkway width.    Change in Gait Speed  Able to smoothly change walking speed without loss of balance  or gait deviation. Deviate no more than 6 in outside of the 12 in walkway width.    Gait with Horizontal Head Turns  Performs head turns smoothly with no change in gait. Deviates no more than 6 in outside 12 in walkway width    Gait with Vertical Head Turns  Performs head turns with no change in gait. Deviates no more than 6 in outside 12 in walkway width.    Gait and Pivot Turn  Pivot turns safely within 3 sec and stops quickly with no loss of balance.    Step Over Obstacle  Is able to step over 2 stacked shoe boxes taped together (9 in total height) without changing gait speed. No evidence of imbalance.    Gait with Narrow Base of Support  Is able to ambulate for 10 steps heel to toe with no staggering.    Gait with Eyes Closed  Walks 20 ft, no assistive devices, good speed, no evidence of imbalance, normal gait pattern, deviates no more than 6 in outside 12 in walkway width. Ambulates 20 ft in less than 7 sec.    Ambulating Backwards  Walks 20 ft, no assistive devices, good speed, no evidence for imbalance, normal gait    Steps  Alternating feet, no rail.    Total Score  30             Objective measurements completed on examination: See above findings.                           Plan - 02/19/18 1549    Clinical Impression Statement  Pt is a 62 year old male who presents to OP PT following CVA/TIA event on 01/30/18, with dizziness and unsteadiness with gait.  Upon 2 day hospitalization and return home, he notes balance and gait is back to normal baseline with no residual deficits.  PT eval indicates today that pt is not at fall risk, as TUG score of 8.38 sec and FGA score 30/30  is WNL; pt's gait velocity is 4.43 ft/sec and he appears to have no skilled needs for PT at this time.    Clinical Presentation  Stable    Clinical Decision Making  Low    PT Frequency  One time visit    PT Next Visit Plan  Eval only, no further PT warranted at this time.       Patient  will benefit from skilled therapeutic intervention in order to improve the following deficits and impairments:     Visit Diagnosis: Unsteadiness on feet     Problem List Patient Active Problem List   Diagnosis Date Noted  . Stroke ([place]) 01/30/2018  . Lymphoma ([place])   . Hypertension   . Family history of colon cancer 12/30/2017  . History of colon cancer 12/30/2017    Stephnie Parlier W. 02/19/2018, 3:53 PM  Mady Haagensen, PT 02/19/18 3:53 PM Phone: [phone] Fax: [facility] [address] [place], [place], [address] Phone: [phone]   Fax:  [phone]  Name: Scott Mathis MRN: [id number] Date of Birth: 11/02/1956

## 2018-02-22 ENCOUNTER — Ambulatory Visit (HOSPITAL_COMMUNITY)
Admission: RE | Admit: 2018-02-22 | Discharge: 2018-02-22 | Disposition: A | Discharge status: Home / Self Care | Payer: 59 | Source: Ambulatory Visit | Attending: Internal Medicine | Admitting: Internal Medicine

## 2018-02-22 DIAGNOSIS — E049 Nontoxic goiter, unspecified: Secondary | ICD-10-CM

## 2018-02-22 DIAGNOSIS — E041 Nontoxic single thyroid nodule: Secondary | ICD-10-CM | POA: Insufficient documentation

## 2018-02-22 DIAGNOSIS — E042 Nontoxic multinodular goiter: Secondary | ICD-10-CM | POA: Diagnosis not present

## 2018-03-01 DIAGNOSIS — C8400 Mycosis fungoides, unspecified site: Secondary | ICD-10-CM | POA: Diagnosis not present

## 2018-03-06 IMAGING — US US THYROID
1 series · 13 of 25 positions shown · non-contrast
Comparison: CT 01/31/2018

CLINICAL DATA: Nontoxic multinodular goiter. Thyroid nodule seen on
CT from 02/01/2018.

EXAM:
THYROID ULTRASOUND
TECHNIQUE: Ultrasound examination of the thyroid gland and adjacent soft
tissues was performed.

[Series 1: us thyroid · 0.06mm/px · 13 of 76 slices shown]
[im 1/76]
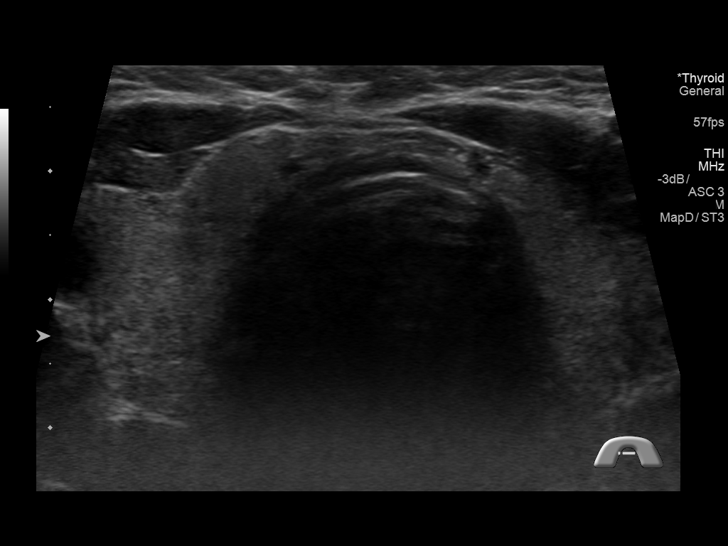
[im 7/76]
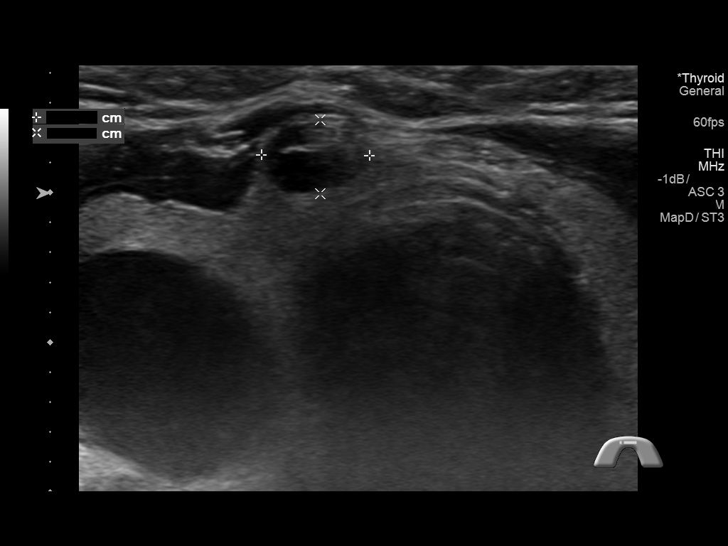
[im 13/76]
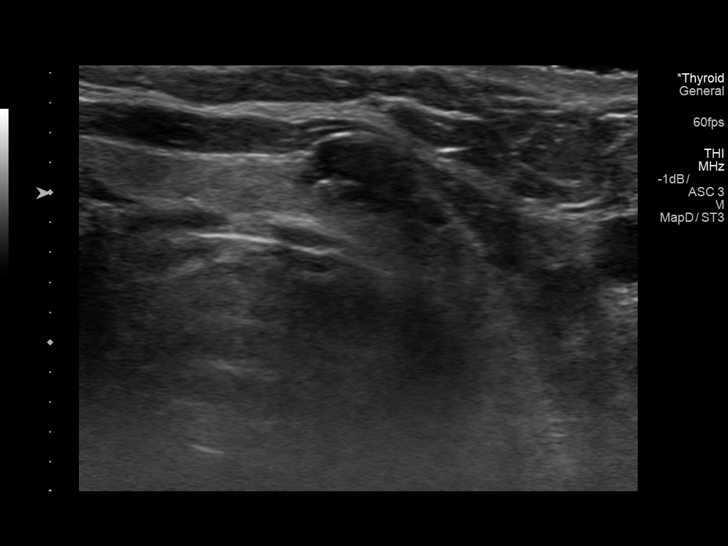
[im 19/76]
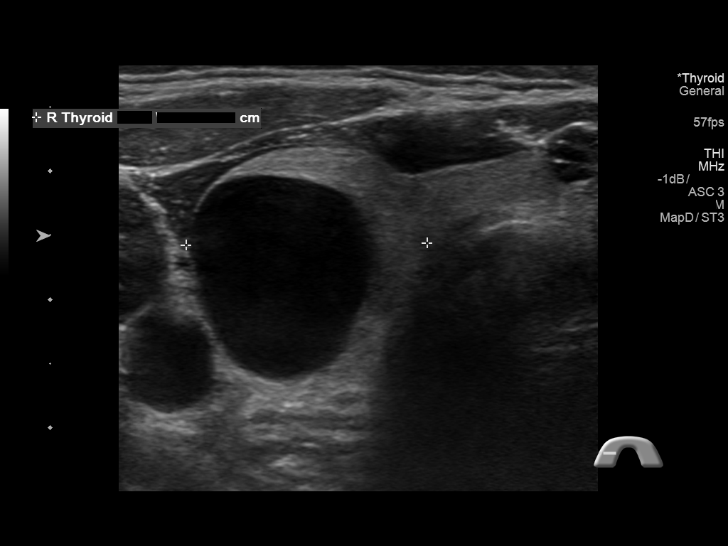
[im 26/76]
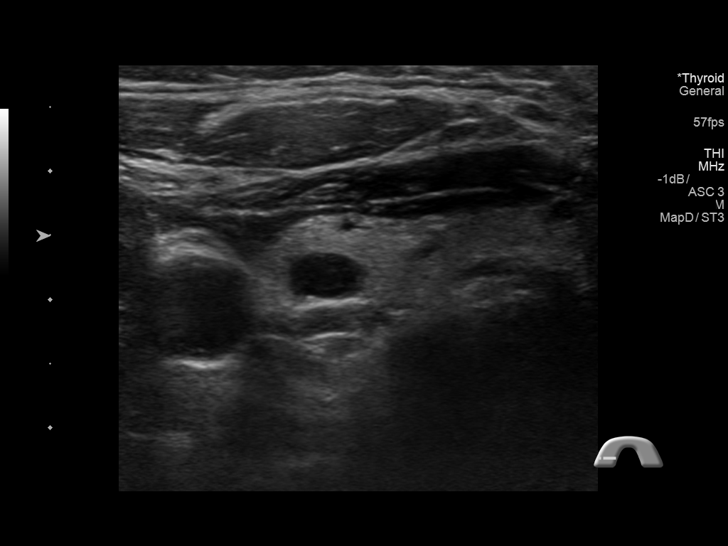
[im 32/76]
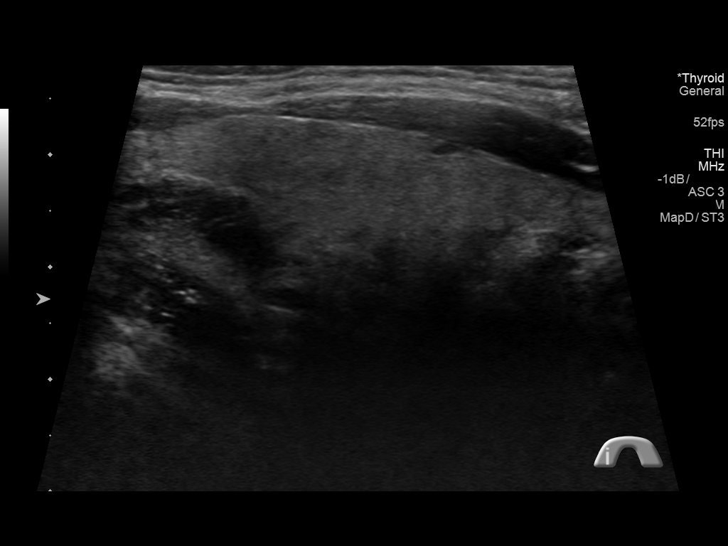
[im 38/76]
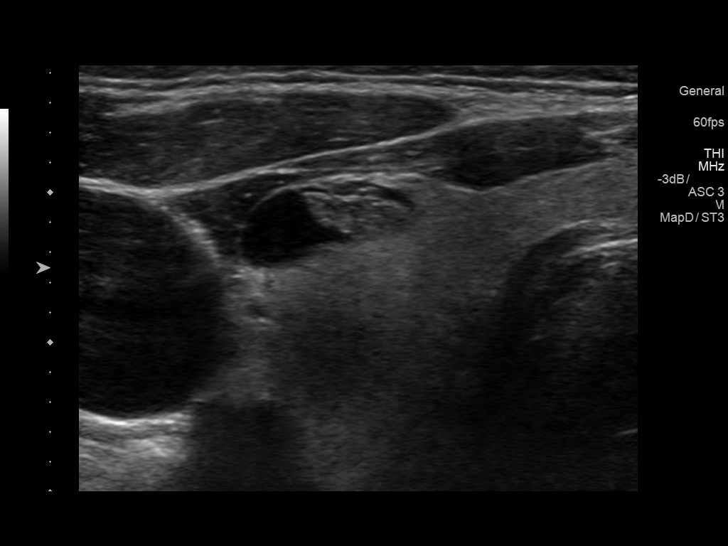
[im 44/76]
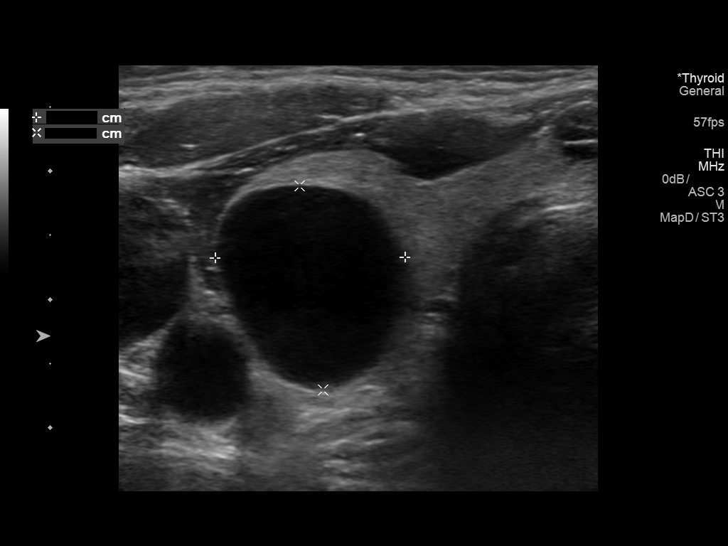
[im 51/76]
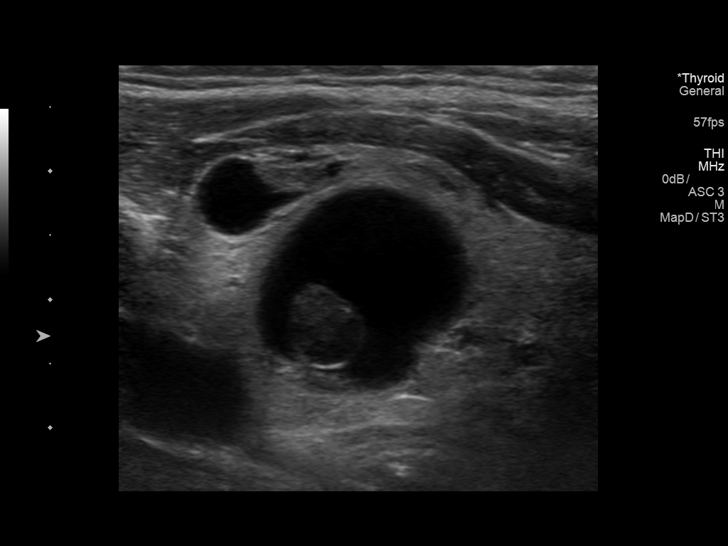
[im 57/76]
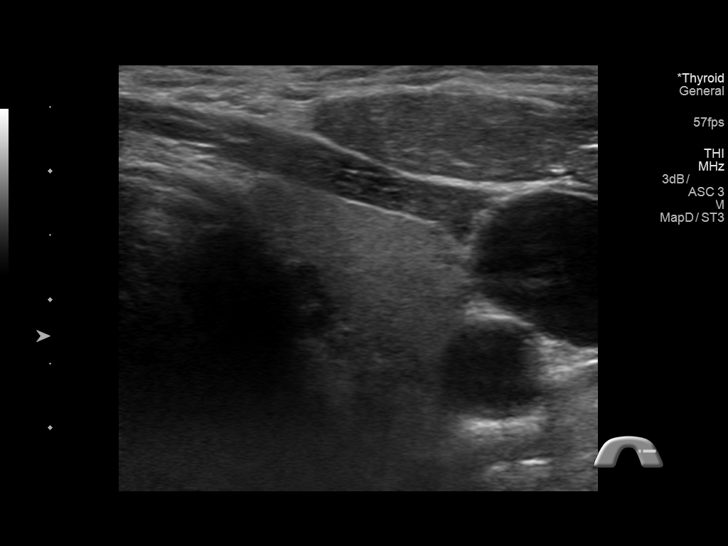
[im 63/76]
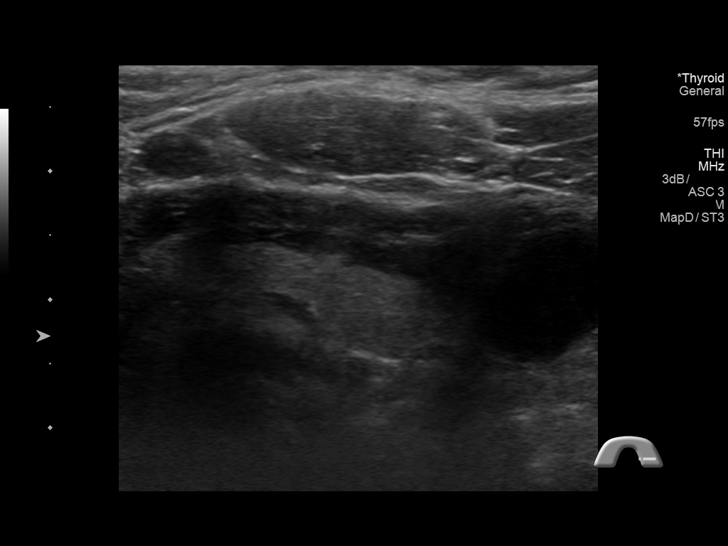
[im 69/76]
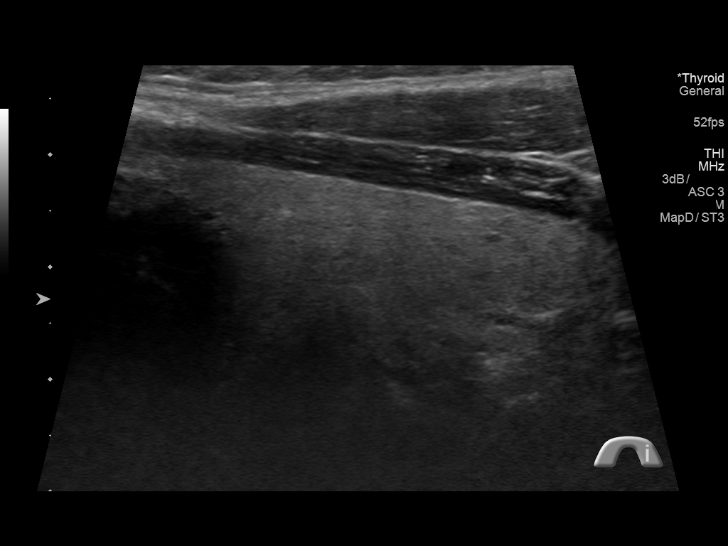
[im 76/76]
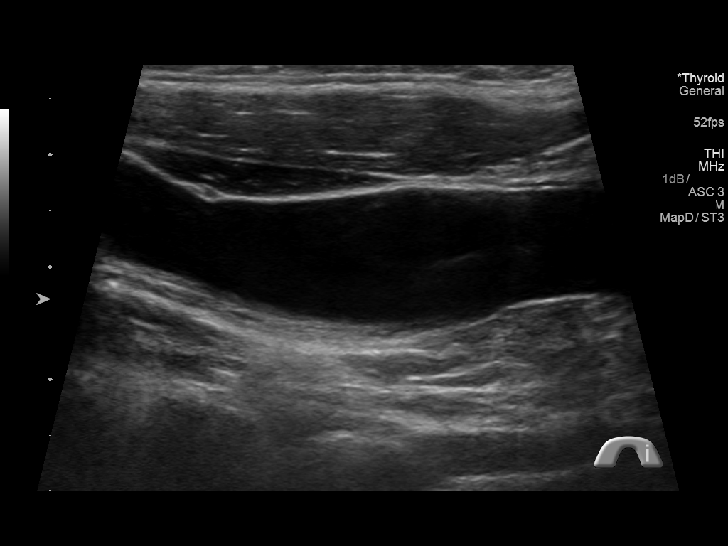

[13 of 25 positions shown; findings below may reference images not displayed]

FINDINGS: Parenchymal Echotexture: Mildly heterogenous

Isthmus: 0.3 cm

Right lobe: 5.5 x 2.2 x 1.9 cm

Left lobe: 5.4 x 1.3 x 1.4 cm

_________________________________________________________

Estimated total number of nodules >/= 1 cm: 2

Number of spongiform nodules >/=  2 cm not described below (TR1): 0

Number of mixed cystic and solid nodules >/= 1.5 cm not described
below (TR2): 0

_________________________________________________________

Nodule # 1:

Location: Isthmus; Superior

Maximum size: 0.7 cm; Other 2 dimensions: 0.5 x 0.7 cm

Composition: mixed cystic and solid (1)

Echogenicity: isoechoic (1)

Shape: not taller-than-wide (0)

Margins: smooth (0)

Echogenic foci: none (0)

ACR TI-RADS total points: 2.

ACR TI-RADS risk category: TR2 (2 points).

ACR TI-RADS recommendations:

This nodule does NOT meet TI-RADS criteria for biopsy or dedicated
follow-up.

Nodule # 2:

Location: Right; Mid

Maximum size: 1.7 cm; Other 2 dimensions: 1.6 x 1.5 cm

Composition: mixed cystic and solid (1)

Echogenicity: hypoechoic (2)

Shape: not taller-than-wide (0)

Margins: smooth (0)

Echogenic foci: none (0)

ACR TI-RADS total points: 3.

ACR TI-RADS risk category: TR3 (3 points).

ACR TI-RADS recommendations:

*Given size (>/= 1.5 - 2.4 cm) and appearance, a follow-up
ultrasound in 1 year should be considered based on TI-RADS criteria.

_________________________________________________________

Nodule # 3:

Location: Right; Superior

Maximum size: 1.2 cm; Other 2 dimensions: 0.4 x 1.2 cm

Composition: mixed cystic and solid (1)

Echogenicity: isoechoic (1)

Shape: not taller-than-wide (0)

Margins: smooth (0)

Echogenic foci: none (0)

ACR TI-RADS total points: 2.

ACR TI-RADS risk category: TR2 (2 points).

ACR TI-RADS recommendations:

This nodule does NOT meet TI-RADS criteria for biopsy or dedicated
follow-up.

_________________________________________________________

No discrete left thyroid nodules.

_________________________________________________________
IMPRESSION: Thyroid nodules that have cystic components. The largest nodule in
the right thyroid lobe is predominantly cystic but meets criteria
for 1 year follow-up.

The above is in keeping with the ACR TI-RADS recommendations - [HOSPITAL] 2799;[DATE].

## 2018-03-09 DIAGNOSIS — C8400 Mycosis fungoides, unspecified site: Secondary | ICD-10-CM | POA: Diagnosis not present

## 2018-03-09 IMAGING — MR MR HEAD WO/W CM
11 of 13 series · 34 of 48 positions shown · IV contrast (multihance)
Comparison: None.

CLINICAL DATA: Stroke follow-up. Dizziness and unsteady gait.
History of lymphoma

EXAM:
MRI HEAD WITHOUT AND WITH CONTRAST
TECHNIQUE: Multiplanar, multiecho pulse sequences of the brain and surrounding
structures were obtained without and with intravenous contrast.
CONTRAST:  20mL MULTIHANCE GADOBENATE DIMEGLUMINE 529 MG/ML IV SOLN

[Series 3: DWI · axial · 3.0mm · 0.94mm/px · z∈[-76,+79]mm · 9 of 106 slices shown (1 of 2)]
[im 1/106]
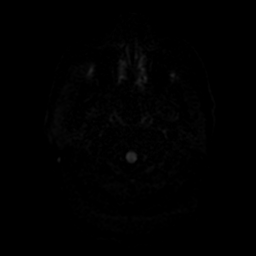
[im 14/106]
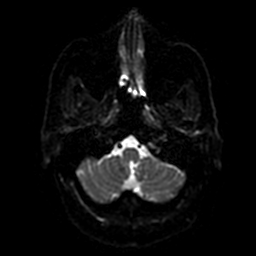
[im 27/106]
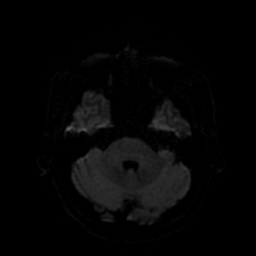
[im 40/106]
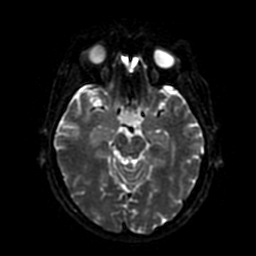
[im 53/106]
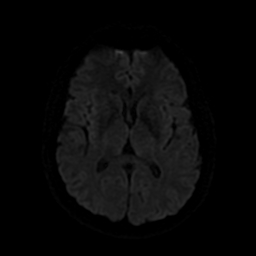
[im 66/106]
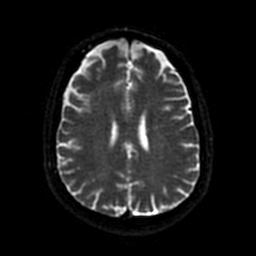
[im 79/106]
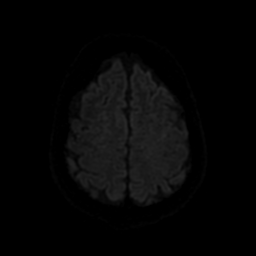
[im 92/106]
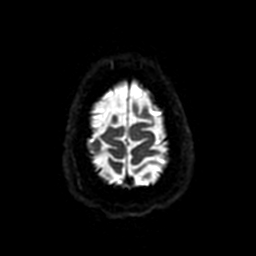
[im 106/106]
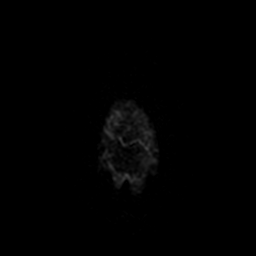

[Series 4: DWI · coronal · 4.0mm · 0.94mm/px · 5 of 72 slices shown (2 of 2)]
[im 1/72]
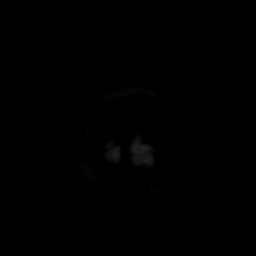
[im 18/72]
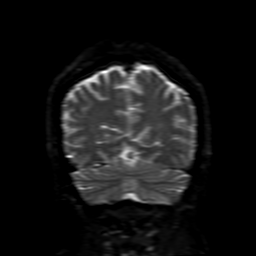
[im 36/72]
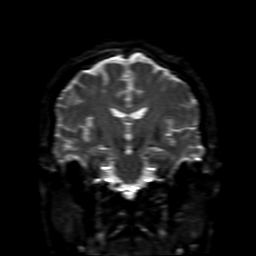
[im 54/72]
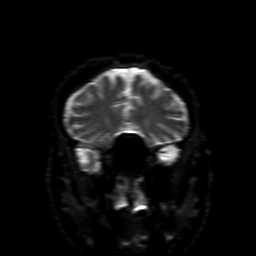
[im 72/72]
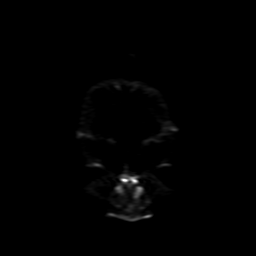

[Series 5: FLAIR · sagittal · 5.0mm · 0.47mm/px · 2 of 25 slices shown (1 of 3)]
[im 1/25]
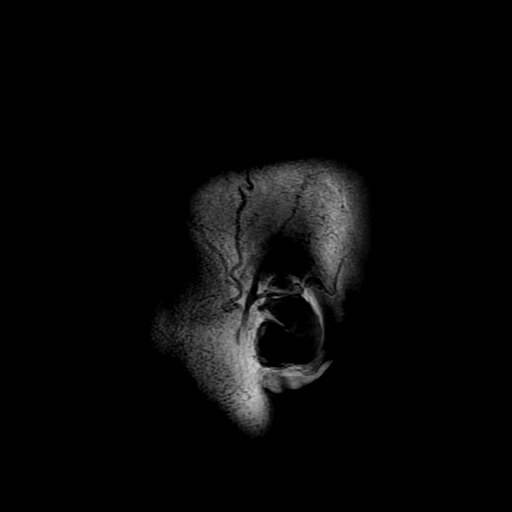
[im 25/25]
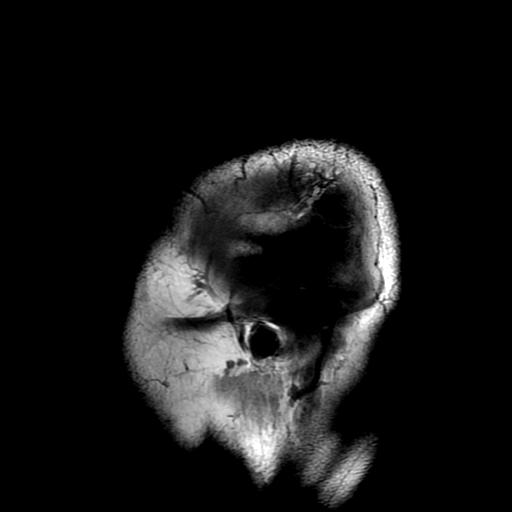

[Series 6: T2 · axial · 5.0mm · 0.47mm/px · z∈[-91,+77]mm · 2 of 29 slices shown]
[im 1/29]
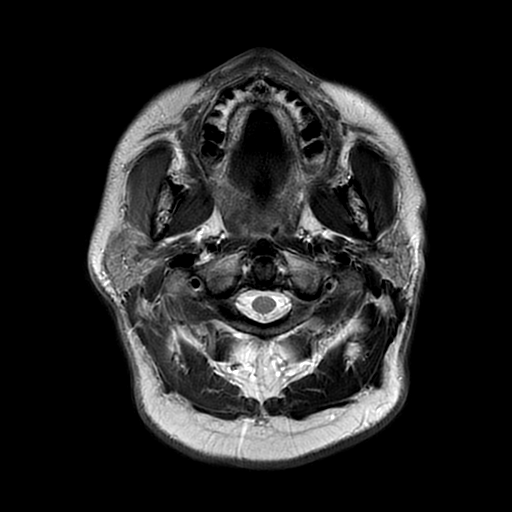
[im 29/29]
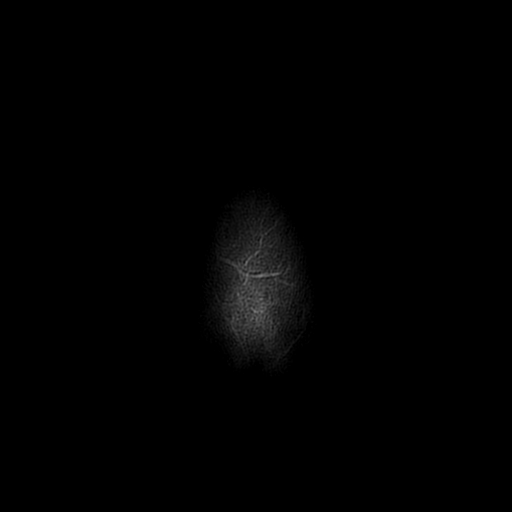

[Series 7: FLAIR · axial · 3.0mm · 0.41mm/px · z∈[-91,+76]mm · 2 of 29 slices shown (2 of 3)]
[im 1/29]
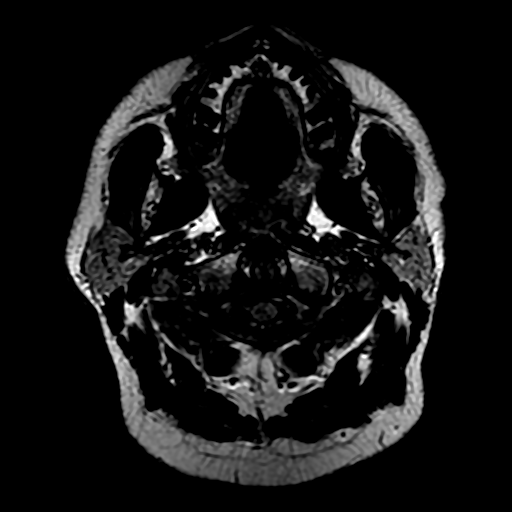
[im 29/29]
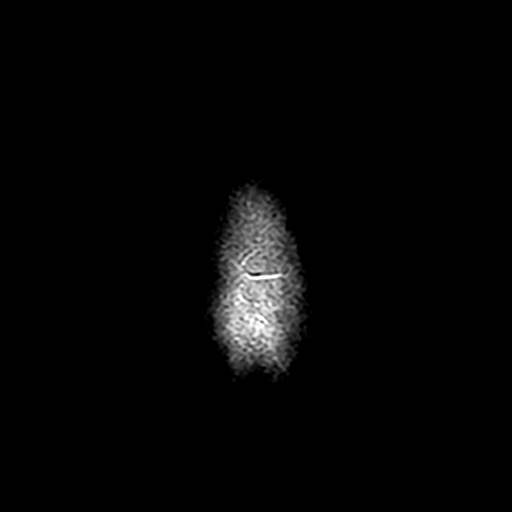

[Series 8: (person_name) · axial · 3.0mm · 0.47mm/px · 1 of 100 slices shown]
[im 1/100]
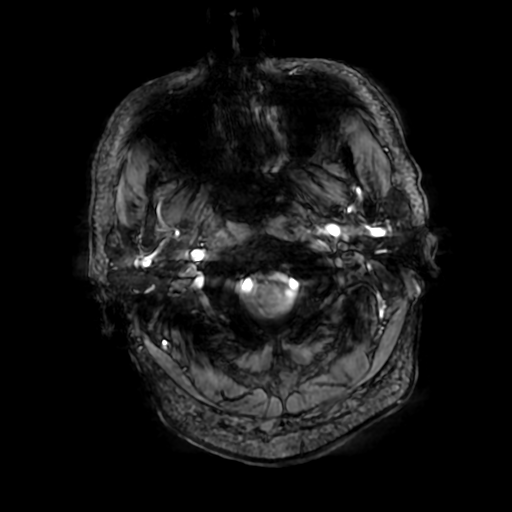

[Series 10: T2 post-contrast · coronal · 5.0mm · 0.39mm/px · 2 of 32 slices shown]
[im 1/32]
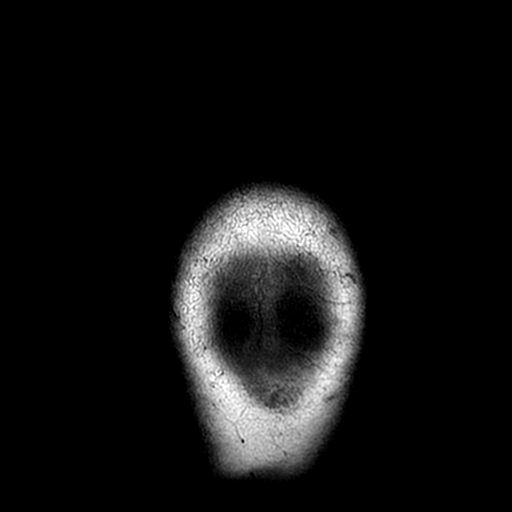
[im 32/32]
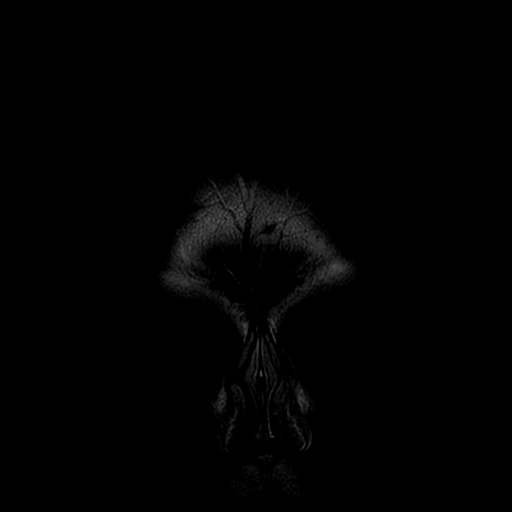

[Series 12: T1 · coronal · 5.0mm · 0.39mm/px · 2 of 32 slices shown]
[im 1/32]
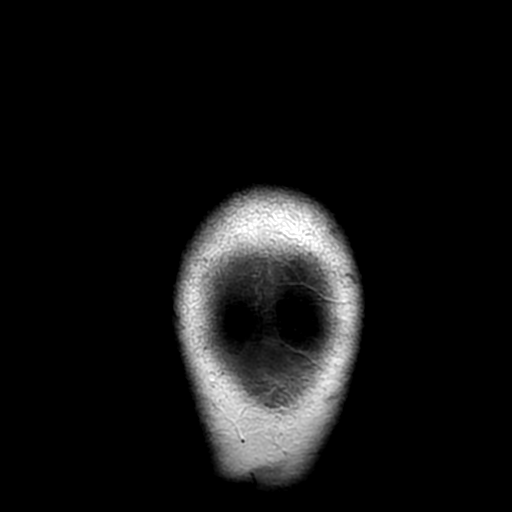
[im 32/32]
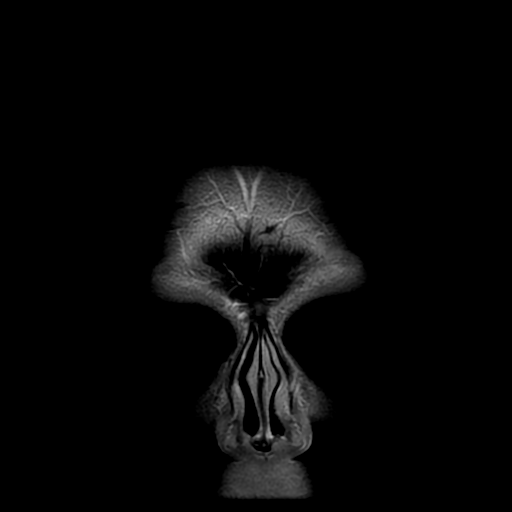

[Series 13: FLAIR · sagittal · 5.0mm · 0.47mm/px · 2 of 25 slices shown (3 of 3)]
[im 1/25]
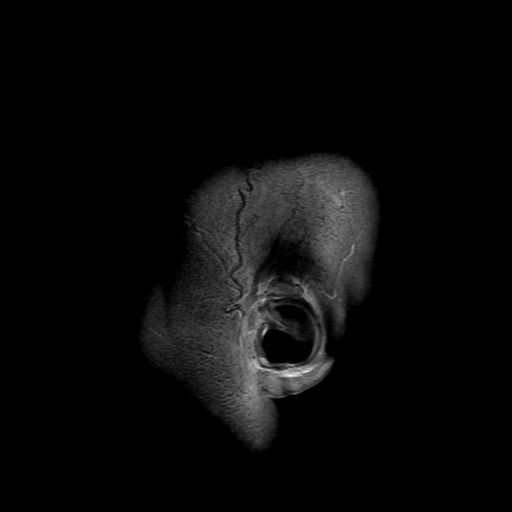
[im 25/25]
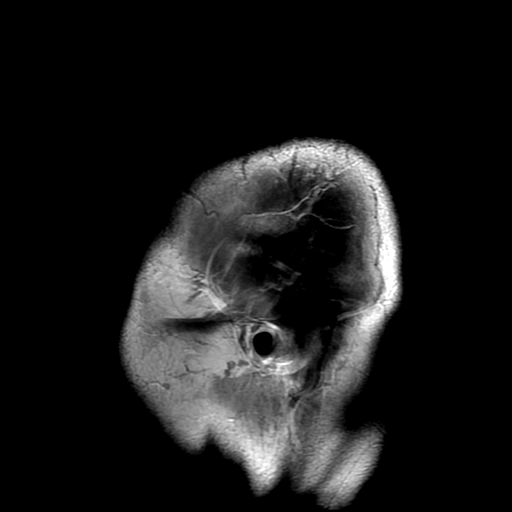

[Series 350: ADC · axial · 3.0mm · 0.94mm/px · z∈[-76,+79]mm · 4 of 52 slices shown (1 of 2)]
[im 1/52]
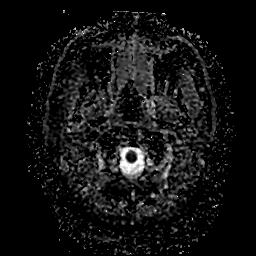
[im 18/52]
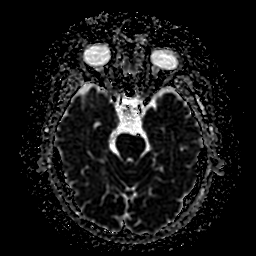
[im 35/52]
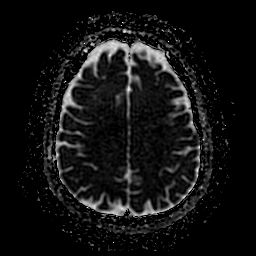
[im 52/52]
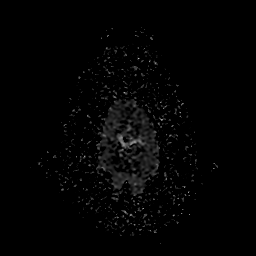

[Series 450: ADC · coronal · 4.0mm · 0.94mm/px · 3 of 36 slices shown (2 of 2)]
[im 1/36]
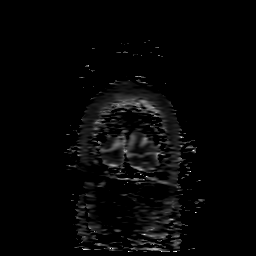
[im 18/36]
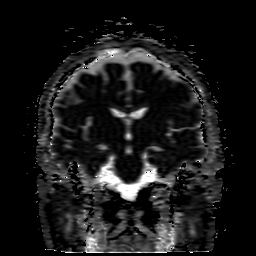
[im 36/36]
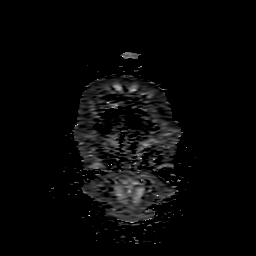

[34 of 48 positions shown; findings below may reference images not displayed]

FINDINGS: Brain: No acute infarction, hemorrhage, hydrocephalus, extra-axial
collection or mass lesion. Approximately 7 tiny FLAIR
hyperintensities in the cerebral white matter attributed to
nonspecific remote insult. These may be related patient's history of
hypertension. Normal brain volume. No abnormal intracranial
enhancement. No inner ear asymmetry or abnormality noted.

Vascular: Major flow voids and vascular enhancements are preserved.

Skull and upper cervical spine: No evidence of marrow lesion.

Sinuses/Orbits: Mild mucosal thickening on the floor of the left
maxillary sinus.
IMPRESSION: 1. No acute finding, including infarct.
2. Few small signal abnormalities in the cerebral white matter which
may be related patient's history of hypertension, but are
nonspecific.

## 2018-03-22 ENCOUNTER — Telehealth (INDEPENDENT_AMBULATORY_CARE_PROVIDER_SITE_OTHER): Payer: Self-pay | Admitting: *Deleted

## 2018-03-22 ENCOUNTER — Encounter (INDEPENDENT_AMBULATORY_CARE_PROVIDER_SITE_OTHER): Payer: Self-pay | Admitting: *Deleted

## 2018-03-22 MED ORDER — PEG 3350-KCL-NA BICARB-NACL 420 G PO SOLR: 4000.0000 mL | Freq: Once | ORAL | 0 refills | Status: AC

## 2018-03-22 NOTE — Telephone Encounter (Signed)
Patient needs trilyte 

## 2018-03-23 ENCOUNTER — Other Ambulatory Visit (HOSPITAL_BASED_OUTPATIENT_CLINIC_OR_DEPARTMENT_OTHER): Payer: Self-pay

## 2018-03-23 DIAGNOSIS — J069 Acute upper respiratory infection, unspecified: Secondary | ICD-10-CM | POA: Diagnosis not present

## 2018-03-23 DIAGNOSIS — R5383 Other fatigue: Secondary | ICD-10-CM

## 2018-03-23 DIAGNOSIS — R0683 Snoring: Secondary | ICD-10-CM

## 2018-03-28 ENCOUNTER — Ambulatory Visit: Payer: 59 | Attending: Internal Medicine | Admitting: Neurology

## 2018-03-28 DIAGNOSIS — R0683 Snoring: Secondary | ICD-10-CM | POA: Diagnosis not present

## 2018-03-28 DIAGNOSIS — Z79899 Other long term (current) drug therapy: Secondary | ICD-10-CM | POA: Diagnosis not present

## 2018-03-28 DIAGNOSIS — Z7982 Long term (current) use of aspirin: Secondary | ICD-10-CM | POA: Insufficient documentation

## 2018-03-28 DIAGNOSIS — G4733 Obstructive sleep apnea (adult) (pediatric): Secondary | ICD-10-CM | POA: Diagnosis not present

## 2018-03-28 DIAGNOSIS — R5383 Other fatigue: Secondary | ICD-10-CM

## 2018-03-31 DIAGNOSIS — D225 Melanocytic nevi of trunk: Secondary | ICD-10-CM | POA: Diagnosis not present

## 2018-03-31 DIAGNOSIS — D2262 Melanocytic nevi of left upper limb, including shoulder: Secondary | ICD-10-CM | POA: Diagnosis not present

## 2018-03-31 DIAGNOSIS — D2261 Melanocytic nevi of right upper limb, including shoulder: Secondary | ICD-10-CM | POA: Diagnosis not present

## 2018-03-31 DIAGNOSIS — D485 Neoplasm of uncertain behavior of skin: Secondary | ICD-10-CM | POA: Diagnosis not present

## 2018-03-31 DIAGNOSIS — L821 Other seborrheic keratosis: Secondary | ICD-10-CM | POA: Diagnosis not present

## 2018-04-04 NOTE — Procedures (Signed)
[name], MD     [email or web address]             NOCTURNAL POLYSOMNOGRAPHY   LOCATION: [facility]  Patient Name: Scott Mathis, Scott Mathis Date: 03/28/2018 Gender: Male D.O.B: 29-May-1956 Age (years): 28 Referring Provider: Hurshel Party Height (inches): 70 Interpreting Physician: Phillips Odor MD, ABSM Weight (lbs): 208 RPSGT: Rosebud Poles BMI: 30 MRN: [id number] Neck Size: 16.50 CLINICAL INFORMATION Sleep Study Type: NPSG     Indication for sleep study: Fatigue, Snoring     Epworth Sleepiness Score: 5     SLEEP STUDY TECHNIQUE As per the AASM Manual for the Scoring of Sleep and Associated Events v2.3 (April 2016) with a hypopnea requiring 4% desaturations.  The channels recorded and monitored were frontal, central and occipital EEG, electrooculogram (EOG), submentalis EMG (chin), nasal and oral airflow, thoracic and abdominal wall motion, anterior tibialis EMG, snore microphone, electrocardiogram, and pulse oximetry.  MEDICATIONS Medications self-administered by patient taken the night of the study : N/A  Current Outpatient Medications:  .  amLODipine (NORVASC) 5 MG tablet, Take 10 mg by mouth daily. , Disp: , Rfl: 0 .  aspirin 325 MG tablet, Take 1 tablet (325 mg total) by mouth daily., Disp: 30 tablet, Rfl: 0 .  atorvastatin (LIPITOR) 20 MG tablet, Take 1 tablet (20 mg total) by mouth daily at 6 PM., Disp: 30 tablet, Rfl: 0 .  folic acid (FOLVITE) 1 MG tablet, Take 1 mg by mouth daily. Daily for 6 days. Methotrexate on the 7th day. Repeat, Disp: , Rfl: 1 .  methotrexate (RHEUMATREX) 2.5 MG tablet, Take 25 mg by mouth every 7 (seven) days. Takes Folic Acid on the other 6 days., Disp: , Rfl: 0 .  Multiple Vitamins-Minerals (MULTIVITAMIN MEN 50+) TABS, Take 1 tablet by mouth daily., Disp: , Rfl:  .  Omega-3 Fatty Acids (FISH OIL) 1200 MG CAPS, Take 1 capsule by mouth daily., Disp: , Rfl:      SLEEP ARCHITECTURE The study was  initiated at 10:00:13 PM and ended at 4:39:02 AM.  Sleep onset time was 2.3 minutes and the sleep efficiency was 85.8%%. The total sleep time was 342.0 minutes.  Stage REM latency was 69.5 minutes.  The patient spent 3.2%% of the night in stage N1 sleep, 38.2%% in stage N2 sleep, 36.1%% in stage N3 and 22.51% in REM.  Alpha intrusion was absent.  Supine sleep was 3.07%.  RESPIRATORY PARAMETERS The overall apnea/hypopnea index (AHI) was 2.3 per hour. There were 2 total apneas, including 1 obstructive, 0 central and 1 mixed apneas. There were 11 hypopneas and 8 RERAs.  The AHI during Stage REM sleep was 1.6 per hour.  AHI while supine was 51.4 per hour.  The mean oxygen saturation was 94.6%. The minimum SpO2 during sleep was 90.0%.  moderate snoring was noted during this study.  CARDIAC DATA The 2 lead EKG demonstrated sinus rhythm. The mean heart rate was 66.4 beats per minute. Other EKG findings include: None. LEG MOVEMENT DATA The total PLMS were 0 with a resulting PLMS index of 0.0. Associated arousal with leg movement index was 0.7.  IMPRESSIONS No significant obstructive sleep apnea occurred during this study. No significant central sleep apnea occurred during this study. Clinically significant periodic limb movements did not occur during sleep. No significant associated arousals.    Delano Metz, MD Diplomate, American Board of Sleep Medicine.  ELECTRONICALLY SIGNED ON:  04/04/2018, 8:21 PM [place]: [phone]  [place]: [phone] [place]

## 2018-04-07 ENCOUNTER — Ambulatory Visit: Payer: 59 | Admitting: Urology

## 2018-04-07 DIAGNOSIS — R972 Elevated prostate specific antigen [PSA]: Secondary | ICD-10-CM | POA: Diagnosis not present

## 2018-04-08 DIAGNOSIS — C8400 Mycosis fungoides, unspecified site: Secondary | ICD-10-CM | POA: Diagnosis not present

## 2018-04-12 DIAGNOSIS — C8400 Mycosis fungoides, unspecified site: Secondary | ICD-10-CM | POA: Diagnosis not present

## 2018-04-13 ENCOUNTER — Encounter: Payer: Self-pay | Admitting: Neurology

## 2018-04-13 ENCOUNTER — Ambulatory Visit: Payer: 59 | Admitting: Neurology

## 2018-04-13 VITALS — BP 126/78 | HR 78 | Ht 70.0 in | Wt 214.0 lb

## 2018-04-13 DIAGNOSIS — G459 Transient cerebral ischemic attack, unspecified: Secondary | ICD-10-CM | POA: Diagnosis not present

## 2018-04-13 NOTE — Patient Instructions (Addendum)
I had a long d/w patient about his recent TIA, risk for recurrent stroke/TIAs, personally independently reviewed imaging studies and stroke evaluation results and answered questions.Continue aspirin 325 mg daily  for secondary stroke prevention and maintain strict control of hypertension with blood pressure goal below 130/90, diabetes with hemoglobin A1c goal below 6.5% and lipids with LDL cholesterol goal below 70 mg/dL. I also advised the patient to eat a healthy diet with plenty of whole grains, cereals, fruits and vegetables, exercise regularly and maintain ideal body weight Followup in the future with my nurse practitioner Janett Billow in 6 months or call earlier .  Stroke Prevention Some medical conditions and behaviors are associated with a higher chance of having a stroke. You can help prevent a stroke by making nutrition, lifestyle, and other changes, including managing any medical conditions you may have. What nutrition changes can be made?  Eat healthy foods. You can do this by: ? Choosing foods high in fiber, such as fresh fruits and vegetables and whole grains. ? Eating at least 5 or more servings of fruits and vegetables a day. Try to fill half of your plate at each meal with fruits and vegetables. ? Choosing lean protein foods, such as lean cuts of meat, poultry without skin, fish, tofu, beans, and nuts. ? Eating low-fat dairy products. ? Avoiding foods that are high in salt (sodium). This can help lower blood pressure. ? Avoiding foods that have saturated fat, trans fat, and cholesterol. This can help prevent high cholesterol. ? Avoiding processed and premade foods.  Follow your health care provider's specific guidelines for losing weight, controlling high blood pressure (hypertension), lowering high cholesterol, and managing diabetes. These may include: ? Reducing your daily calorie intake. ? Limiting your daily sodium intake to 1,500 milligrams (mg). ? Using only healthy fats for  cooking, such as olive oil, canola oil, or sunflower oil. ? Counting your daily carbohydrate intake. What lifestyle changes can be made?  Maintain a healthy weight. Talk to your health care provider about your ideal weight.  Get at least 30 minutes of moderate physical activity at least 5 days a week. Moderate activity includes brisk walking, biking, and swimming.  Do not use any products that contain nicotine or tobacco, such as cigarettes and e-cigarettes. If you need help quitting, ask your health care provider. It may also be helpful to avoid exposure to secondhand smoke.  Limit alcohol intake to no more than 1 drink a day for nonpregnant women and 2 drinks a day for men. One drink equals 12 oz of beer, 5 oz of wine, or 1 oz of hard liquor.  Stop any illegal drug use.  Avoid taking birth control pills. Talk to your health care provider about the risks of taking birth control pills if: ? You are over 68 years old. ? You smoke. ? You get migraines. ? You have ever had a blood clot. What other changes can be made?  Manage your cholesterol levels. ? Eating a healthy diet is important for preventing high cholesterol. If cholesterol cannot be managed through diet alone, you may also need to take medicines. ? Take any prescribed medicines to control your cholesterol as told by your health care provider.  Manage your diabetes. ? Eating a healthy diet and exercising regularly are important parts of managing your blood sugar. If your blood sugar cannot be managed through diet and exercise, you may need to take medicines. ? Take any prescribed medicines to control your diabetes as told by  your health care provider.  Control your hypertension. ? To reduce your risk of stroke, try to keep your blood pressure below 130/80. ? Eating a healthy diet and exercising regularly are an important part of controlling your blood pressure. If your blood pressure cannot be managed through diet and exercise,  you may need to take medicines. ? Take any prescribed medicines to control hypertension as told by your health care provider. ? Ask your health care provider if you should monitor your blood pressure at home. ? Have your blood pressure checked every year, even if your blood pressure is normal. Blood pressure increases with age and some medical conditions.  Get evaluated for sleep disorders (sleep apnea). Talk to your health care provider about getting a sleep evaluation if you snore a lot or have excessive sleepiness.  Take over-the-counter and prescription medicines only as told by your health care provider. Aspirin or blood thinners (antiplatelets or anticoagulants) may be recommended to reduce your risk of forming blood clots that can lead to stroke.  Make sure that any other medical conditions you have, such as atrial fibrillation or atherosclerosis, are managed. What are the warning signs of a stroke? The warning signs of a stroke can be easily remembered as BEFAST.  B is for balance. Signs include: ? Dizziness. ? Loss of balance or coordination. ? Sudden trouble walking.  E is for eyes. Signs include: ? A sudden change in vision. ? Trouble seeing.  F is for face. Signs include: ? Sudden weakness or numbness of the face. ? The face or eyelid drooping to one side.  A is for arms. Signs include: ? Sudden weakness or numbness of the arm, usually on one side of the body.  S is for speech. Signs include: ? Trouble speaking (aphasia). ? Trouble understanding.  T is for time. ? These symptoms may represent a serious problem that is an emergency. Do not wait to see if the symptoms will go away. Get medical help right away. Call your local emergency services (911 in the [place] Do not drive yourself to the hospital.  Other signs of stroke may include: ? A sudden, severe headache with no known cause. ? Nausea or vomiting. ? Seizure.  Where to find more information: For more  information, visit:  American Stroke Association: www.strokeassociation.org  National Stroke Association: www.stroke.org  Summary  You can prevent a stroke by eating healthy, exercising, not smoking, limiting alcohol intake, and managing any medical conditions you may have.  Do not use any products that contain nicotine or tobacco, such as cigarettes and e-cigarettes. If you need help quitting, ask your health care provider. It may also be helpful to avoid exposure to secondhand smoke.  Remember BEFAST for warning signs of stroke. Get help right away if you or a loved one has any of these signs. This information is not intended to replace advice given to you by your health care provider. Make sure you discuss any questions you have with your health care provider. Document Released: 01/15/2005 Document Revised: 01/13/2017 Document Reviewed: 01/13/2017 Elsevier Interactive Patient Education  [employer].

## 2018-04-13 NOTE — Progress Notes (Signed)
[facility] [address] [place]. [place] [address] [phone]       OFFICE FOLLOW-UP NOTE  Scott Mathis Date of Birth:  1956-02-23 Medical Record Number:  [id number]   HPI: Mr Balderston is a 62 year old Caucasian male seen today for the first office follow-up visit following hospital admission for T IA in February 2019. History is obtained from the patient and review of electronic medical records. 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 When he tried to walk, he felt like he was walking on a ship like a drunken sailor. His symptoms lasted several hours un and he gradually improved the next day.He denied any preceding neck injury. Denies any chiropractic maneuvers. Denies any head injury. Denies any similar symptoms in the past. Denies any preceding sicknesses or illnesses. Denies any drug use or alcohol use. Denies any headache or visual symptoms including diplopia or blurred vision. Denied focal tingling numbness or weakness. Denies slurred speech or aphasia. Denies chest pain, shortness of breath, cough, fevers, chills. He does report nausea but no vomiting. LKW: 10 PM on 01/29/2018 when he went to bed tpa given?: no, outside the window Premorbid modified Rankin scale (mRS):o CT scan of the head at [facility] suggested  Possibility of right cerebellar infarct and hence patient was transferred t. MRI of the brain however did not show any acute inf CT angiogram showed no significant stenosis in t posterior inferior cerebellar artery. Transthoracic echocardiogram show  cardiac source of embolism. lDL cholesterol was 90 mg percent and hemoglobin A1c was 5.2. Patient was started on aspirin for stroke prevention and Lipitor. He states he has done well since discharge and has not had any recurrent gait ataxia or any other new focal neurological symptoms.   He is tolerating Lipitor well without muscle aches or pains.he has no new complaints today.   ROS:   14 system review of systems is positive for  No complaints today and all systems negative PMH:  Past Medical History:  Diagnosis Date  . Hypertension   . Lymphoma ([place])   . Stroke [facility])     Social History:  Social History   Socioeconomic History  . Marital status: Married    Spouse name: Not on file  . Number of children: Not on file  . Years of education: Not on file  . Highest education level: Not on file  Occupational History  . Not on file  Social Needs  . Financial resource strain: Not on file  . Food insecurity:    Worry: Not on file    Inability: Not on file  . Transportation needs:    Medical: Not on file    Non-medical: Not on file  Tobacco Use  . Smoking status: Never Smoker  . Smokeless tobacco: Never Used  Substance and Sexual Activity  . Alcohol use: No    Frequency: Never  . Drug use: No  . Sexual activity: Not on file  Lifestyle  . Physical activity:    Days per week: Not on file    Minutes per session: Not on file  . Stress: Not on file  Relationships  .  Social connections:    Talks on phone: Not on file    Gets together: Not on file    Attends religious service: Not on file    Active member of club or organization: Not on file    Attends meetings of clubs or organizations: Not on file    Relationship status: Not on file  . Intimate partner violence:    Fear of current or ex partner: Not on file    Emotionally abused: Not on file    Physically abused: Not on file    Forced sexual activity: Not on file  Other Topics Concern  . Not on file  Social History  Narrative  . Not on file    Medications:   Current Outpatient Medications on File Prior to Visit  Medication Sig Dispense Refill  . amLODipine (NORVASC) 10 MG tablet Take by mouth.    Marland Kitchen aspirin 325 MG tablet Take 1 tablet (325 mg total) by mouth daily. 30 tablet 0  . atorvastatin (LIPITOR) 20 MG tablet Take 1 tablet (20 mg total) by mouth daily at 6 PM. 30 tablet 0  . Multiple Vitamin (MULTI-VITAMINS) TABS Take by mouth.    . Omega-3 Fatty Acids (FISH OIL) 1200 MG CAPS Take 1 capsule by mouth daily.    Marland Kitchen spironolactone-hydrochlorothiazide (ALDACTAZIDE) 25-25 MG tablet Take by mouth.    . tadalafil (CIALIS) 5 MG tablet Take by mouth.     No current facility-administered medications on file prior to visit.     Allergies:  No Known Allergies  Physical Exam General: well developed, well nourished slightly overweight middle-aged Caucasian male, seated, in no evident distress Head: head normocephalic and atraumatic.  Neck: supple with no carotid or supraclavicular bruits Cardiovascular: regular rate and rhythm, no murmurs Musculoskeletal: no deformity Skin:  no rash/petichiae Vascular:  Normal pulses all extremities Vitals:   04/13/18 1500  BP: 126/78  Pulse: 78   Neurologic Exam Mental Status: Awake and fully alert. Oriented to place and time. Recent and remote memory intact. Attention span, concentration and fund of knowledge appropriate. Mood and affect appropriate.  Cranial Nerves: Fundoscopic exam reveals sharp disc margins. Pupils equal, briskly reactive to light. Extraocular movements full without nystagmus. Visual fields full to confrontation. Hearing intact. Facial sensation intact. Face, tongue, palate moves normally and symmetrically.  Motor: Normal bulk and tone. Normal strength in all tested extremity muscles. Sensory.: intact to touch ,pinprick .position and vibratory sensation.  Coordination: Rapid alternating movements normal in all extremities. Finger-to-nose and  heel-to-shin performed accurately bilaterally. Gait and Station: Arises from chair without difficulty. Stance is normal. Gait demonstrates normal stride length and balance . Able to heel, toe and tandem walk without difficulty.  Reflexes: 1+ and symmetric. Toes downgoing.   NIHSS  0Modified Rankin  0   ASSESSMENT: 62 year old Caucasian male with transient episode of gait ataxia due to posterior circulation TIA. Vascular risk factors of hypertension, hyperlipidimia , intracranial stenosis and mild  obesity    PLAN: I had a long d/w patient about his recent TIA, risk for recurrent stroke/TIAs, personally independently reviewed imaging studies and stroke evaluation results and answered questions.Continue aspirin 325 mg daily  for secondary stroke prevention and maintain strict control of hypertension with blood pressure goal below 130/90, diabetes with hemoglobin A1c goal below 6.5% and lipids with LDL cholesterol goal below 70 mg/dL. I also advised the patient to eat a healthy diet with plenty of whole grains, cereals, fruits and vegetables, exercise regularly and maintain ideal body  weight Followup in the future with my nurse practitioner Janett Billow in 6 months or call earlier . Greater than 50% of time during this 25 minute visit was spent on counseling,explanation of diagnosis of TIA, planning of further management, discussion with patient and family and coordination of care Antony Contras, MD  [facility] [address] [place], [place] [address]  Phone [phone] Fax [phone] Note: This document was prepared with digital dictation and possible smart phrase technology. Any transcriptional errors that result from this process are unintentional

## 2018-04-15 ENCOUNTER — Telehealth (INDEPENDENT_AMBULATORY_CARE_PROVIDER_SITE_OTHER): Payer: Self-pay | Admitting: *Deleted

## 2018-04-15 DIAGNOSIS — C8400 Mycosis fungoides, unspecified site: Secondary | ICD-10-CM | POA: Diagnosis not present

## 2018-04-15 NOTE — Telephone Encounter (Signed)
Referring MD/PCP: gosrani   Procedure: tcs  Reason/Indication:  fam hx colon ca, hx polyps  Has patient had this procedure before?  Yes, 2013 [facility])  If so, when, by whom and where?    Is there a family history of colon cancer?  Yes, maternal grandmother, aunt  Who?  What age when diagnosed?    Is patient diabetic?   no      Does patient have prosthetic heart valve or mechanical valve?  no  Do you have a pacemaker?  no  Has patient ever had endocarditis? no  Has patient had joint replacement within last 12 months?  no  Is patient constipated or do they take laxatives? no  Does patient have a history of alcohol/drug use?  no  Is patient on blood thinner such as Coumadin, Plavix and/or Aspirin? yes  Medications: asa 81 mg daily, methotrexate 25 mg 1 day a week, folic acid 1 mg 6 days a week, amlodipine 5 mg daily, MVI daily, fish oil 1200 mg daily  Allergies: nkda  Medication Adjustment per Dr Lindi Adie, NP: asa 2 days  Procedure date & time: 05/12/18 at 930

## 2018-04-19 DIAGNOSIS — C8400 Mycosis fungoides, unspecified site: Secondary | ICD-10-CM | POA: Diagnosis not present

## 2018-04-19 NOTE — Telephone Encounter (Signed)
agree

## 2018-04-22 DIAGNOSIS — C8400 Mycosis fungoides, unspecified site: Secondary | ICD-10-CM | POA: Diagnosis not present

## 2018-04-26 DIAGNOSIS — C8400 Mycosis fungoides, unspecified site: Secondary | ICD-10-CM | POA: Diagnosis not present

## 2018-05-03 DIAGNOSIS — K219 Gastro-esophageal reflux disease without esophagitis: Secondary | ICD-10-CM | POA: Diagnosis not present

## 2018-05-03 DIAGNOSIS — I10 Essential (primary) hypertension: Secondary | ICD-10-CM | POA: Diagnosis not present

## 2018-05-03 DIAGNOSIS — C8400 Mycosis fungoides, unspecified site: Secondary | ICD-10-CM | POA: Diagnosis not present

## 2018-05-04 DIAGNOSIS — D225 Melanocytic nevi of trunk: Secondary | ICD-10-CM | POA: Diagnosis not present

## 2018-05-04 DIAGNOSIS — D485 Neoplasm of uncertain behavior of skin: Secondary | ICD-10-CM | POA: Diagnosis not present

## 2018-05-06 DIAGNOSIS — C8400 Mycosis fungoides, unspecified site: Secondary | ICD-10-CM | POA: Diagnosis not present

## 2018-05-10 DIAGNOSIS — C8400 Mycosis fungoides, unspecified site: Secondary | ICD-10-CM | POA: Diagnosis not present

## 2018-05-12 ENCOUNTER — Ambulatory Visit (HOSPITAL_COMMUNITY)
Admission: RE | Admit: 2018-05-12 | Discharge: 2018-05-12 | Disposition: A | Discharge status: Home / Self Care | Payer: 59 | Source: Ambulatory Visit | Attending: Internal Medicine | Admitting: Internal Medicine

## 2018-05-12 ENCOUNTER — Other Ambulatory Visit: Payer: Self-pay

## 2018-05-12 ENCOUNTER — Encounter (HOSPITAL_COMMUNITY)
Admission: RE | Disposition: A | Discharge status: Home / Self Care | Payer: Self-pay | Source: Ambulatory Visit | Attending: Internal Medicine

## 2018-05-12 ENCOUNTER — Encounter (HOSPITAL_COMMUNITY): Payer: Self-pay | Admitting: *Deleted

## 2018-05-12 DIAGNOSIS — I10 Essential (primary) hypertension: Secondary | ICD-10-CM | POA: Diagnosis not present

## 2018-05-12 DIAGNOSIS — Z1211 Encounter for screening for malignant neoplasm of colon: Secondary | ICD-10-CM | POA: Insufficient documentation

## 2018-05-12 DIAGNOSIS — Z8673 Personal history of transient ischemic attack (TIA), and cerebral infarction without residual deficits: Secondary | ICD-10-CM | POA: Insufficient documentation

## 2018-05-12 DIAGNOSIS — K219 Gastro-esophageal reflux disease without esophagitis: Secondary | ICD-10-CM | POA: Diagnosis not present

## 2018-05-12 DIAGNOSIS — Z86010 Personal history of colonic polyps: Secondary | ICD-10-CM | POA: Diagnosis not present

## 2018-05-12 DIAGNOSIS — K648 Other hemorrhoids: Secondary | ICD-10-CM | POA: Diagnosis not present

## 2018-05-12 DIAGNOSIS — Q438 Other specified congenital malformations of intestine: Secondary | ICD-10-CM | POA: Insufficient documentation

## 2018-05-12 DIAGNOSIS — K5730 Diverticulosis of large intestine without perforation or abscess without bleeding: Secondary | ICD-10-CM | POA: Diagnosis not present

## 2018-05-12 DIAGNOSIS — Z800 Family history of malignant neoplasm of digestive organs: Secondary | ICD-10-CM | POA: Diagnosis not present

## 2018-05-12 DIAGNOSIS — Z7982 Long term (current) use of aspirin: Secondary | ICD-10-CM | POA: Diagnosis not present

## 2018-05-12 DIAGNOSIS — Z8572 Personal history of non-Hodgkin lymphomas: Secondary | ICD-10-CM | POA: Diagnosis not present

## 2018-05-12 DIAGNOSIS — Z791 Long term (current) use of non-steroidal anti-inflammatories (NSAID): Secondary | ICD-10-CM | POA: Insufficient documentation

## 2018-05-12 DIAGNOSIS — Z79899 Other long term (current) drug therapy: Secondary | ICD-10-CM | POA: Insufficient documentation

## 2018-05-12 DIAGNOSIS — Z85038 Personal history of other malignant neoplasm of large intestine: Secondary | ICD-10-CM

## 2018-05-12 HISTORY — PX: COLONOSCOPY: SHX5424

## 2018-05-12 HISTORY — DX: Gastro-esophageal reflux disease without esophagitis: K21.9

## 2018-05-12 HISTORY — DX: Polyp of colon: K63.5

## 2018-05-12 SURGERY — COLONOSCOPY
Anesthesia: Moderate Sedation

## 2018-05-12 MED ORDER — ONDANSETRON HCL 4 MG/2ML IJ SOLN
INTRAMUSCULAR | Status: DC | PRN
  Administered 2018-05-12: 4 mg via INTRAVENOUS

## 2018-05-12 MED ORDER — MIDAZOLAM HCL 5 MG/5ML IJ SOLN
INTRAMUSCULAR | Status: DC | PRN
  Administered 2018-05-12: 2 mg via INTRAVENOUS
  Administered 2018-05-12 (×2): 1 mg via INTRAVENOUS
  Administered 2018-05-12 (×2): 2 mg via INTRAVENOUS

## 2018-05-12 MED ORDER — STERILE WATER FOR IRRIGATION IR SOLN
Status: DC | PRN
  Administered 2018-05-12: 09:00:00

## 2018-05-12 MED ORDER — ONDANSETRON HCL 4 MG/2ML IJ SOLN
INTRAMUSCULAR | Status: AC
  Filled 2018-05-12: qty 2

## 2018-05-12 MED ORDER — MEPERIDINE HCL 50 MG/ML IJ SOLN
INTRAMUSCULAR | Status: AC
  Filled 2018-05-12: qty 1

## 2018-05-12 MED ORDER — MIDAZOLAM HCL 5 MG/5ML IJ SOLN
INTRAMUSCULAR | Status: AC
  Filled 2018-05-12: qty 10

## 2018-05-12 MED ORDER — MEPERIDINE HCL 50 MG/ML IJ SOLN
INTRAMUSCULAR | Status: DC | PRN
  Administered 2018-05-12: 25 mg via INTRAVENOUS
  Administered 2018-05-12: 10 mg via INTRAVENOUS
  Administered 2018-05-12: 15 mg via INTRAVENOUS

## 2018-05-12 MED ORDER — SODIUM CHLORIDE 0.9 % IV SOLN
INTRAVENOUS | Status: DC
  Administered 2018-05-12: 09:00:00 via INTRAVENOUS

## 2018-05-12 NOTE — H&P (Signed)
Scott Mathis is an 62 y.o. male.   Chief Complaint: Patient is here for colonoscopy. HPI: 62 year old Caucasian male who has history of colonic polyps and for family history of CRC who is here for surveillance colonoscopy.  He denies abdominal pain change in bowel habits or rectal bleeding.  Last aspirin dose was 3 days ago. Last colonoscopy was about 6 years ago. Family history is positive for CRC and paternal grandfather and his sister.  He believes he was in his 56s at the time of diagnosis and ended up with a colostomy and lived to be 38.  Past Medical History:  Diagnosis Date  . Colon polyps   . GERD (gastroesophageal reflux disease)   . Hypertension   . Lymphoma ([place])   . TIA [facility])     Past Surgical History:  Procedure Laterality Date  . Colonoscopy    . COLONOSCOPY    . COLONOSCOPY    . Right shoulder surgery    . TONSILLECTOMY      Family History  Problem Relation Age of Onset  . Colon cancer Paternal great aunt   . Colon cancer Paternal Grandfather    Social History:  reports that he has never smoked. He has never used smokeless tobacco. He reports that he does not drink alcohol or use drugs.  Allergies: No Known Allergies  Medications Prior to Admission  Medication Sig Dispense Refill  . amLODipine (NORVASC) 10 MG tablet Take 10 mg by mouth daily.     Marland Kitchen aspirin 325 MG tablet Take 1 tablet (325 mg total) by mouth daily. 30 tablet 0  . atorvastatin (LIPITOR) 20 MG tablet Take 1 tablet (20 mg total) by mouth daily at 6 PM. 30 tablet 0  . Multiple Vitamin (MULTI-VITAMINS) TABS Take 1 tablet by mouth daily.     . mupirocin ointment (BACTROBAN) 2 % Apply 1 application topically daily.  0  . naproxen sodium (ALEVE) 220 MG tablet Take 440 mg by mouth daily as needed (pain).    . Omega-3 Fatty Acids (FISH OIL) 1000 MG CAPS Take 1,000 mg by mouth daily.     . pantoprazole (PROTONIX) 40 MG tablet Take 40 mg by mouth daily.  1    No results found for this or any previous  visit (from the past 48 hour(s)). No results found.  ROS  Blood pressure 129/68, pulse 65, temperature 97.7 F (36.5 C), temperature source Oral, resp. rate 20, height 5\' 10"  (1.778 m), weight 201 lb (91.2 kg), SpO2 95 %. Physical Exam  Constitutional: He appears well-developed and well-nourished.  HENT:  Mouth/Throat: Oropharynx is clear and moist.  Eyes: Conjunctivae are normal. No scleral icterus.  Neck: No thyromegaly present.  Cardiovascular: Normal rate, regular rhythm and normal heart sounds.  No murmur heard. Respiratory: Effort normal and breath sounds normal.  GI: Soft. There is no tenderness.  Musculoskeletal: He exhibits no edema.  Lymphadenopathy:    He has no cervical adenopathy.  Neurological: He is alert.  Skin: Skin is warm and dry.     Assessment/Plan History of colonic polyps. Family history of CRC. Surveillance colonoscopy.  Hildred Laser, MD 05/12/2018, 9:11 AM

## 2018-05-12 NOTE — Op Note (Signed)
[facility] Patient Name: Scott Mathis Procedure Date: 05/12/2018 8:37 AM MRN: [id number] Date of Birth: 02-27-1956 Attending MD: Hildred Laser , MD CSN: [id number] Age: 62 Admit Type: Outpatient Procedure:                Colonoscopy Indications:              High risk colon cancer surveillance: Personal                            history of colonic polyps, Family history of colon                            cancer in multiple second-degree relatives Providers:                Hildred Laser, MD, Otis Peak B. Sharon Seller, RN, Aram Candela Referring MD:             Doree Albee, MD Medicines:                Meperidine 50 mg IV, Midazolam 8 mg IV, Ondansetron                            4 mg IV Complications:            No immediate complications. Estimated Blood Loss:     Estimated blood loss: none. Procedure:                Pre-Anesthesia Assessment:                           - Prior to the procedure, a History and Physical                            was performed, and patient medications and                            allergies were reviewed. The patient's tolerance of                            previous anesthesia was also reviewed. The risks                            and benefits of the procedure and the sedation                            options and risks were discussed with the patient.                            All questions were answered, and informed consent                            was obtained. Prior Anticoagulants: The patient  last took aspirin 3 days prior to the procedure.                            ASA Grade Assessment: II - A patient with mild                            systemic disease. After reviewing the risks and                            benefits, the patient was deemed in satisfactory                            condition to undergo the procedure.                           After obtaining informed  consent, the colonoscope                            was passed under direct vision. Throughout the                            procedure, the patient's blood pressure, pulse, and                            oxygen saturations were monitored continuously. The                            EC-[id number] ([id number]) scope was introduced through                            the anus and advanced to the the cecum, identified                            by appendiceal orifice and ileocecal valve. The                            colonoscopy was somewhat difficult due to a                            tortuous colon. The patient tolerated the procedure                            well. The quality of the bowel preparation was                            good. The ileocecal valve, appendiceal orifice, and                            rectum were photographed. Scope In: 9:20:33 AM Scope Out: 9:48:40 AM Scope Withdrawal Time: 0 hours 11 minutes 21 seconds  Total Procedure Duration: 0 hours 28 minutes 7 seconds  Findings:      The perianal and digital rectal examinations were normal.      A single small-mouthed diverticulum  was found in the ascending colon.      A few small-mouthed diverticula were found in the sigmoid colon.      The exam was otherwise normal throughout the examined colon.      Internal hemorrhoids were found during retroflexion. The hemorrhoids       were small.      The terminal ileum appeared normal. Impression:               - Normal terminal ileum.                           - Diverticulosis in the ascending colon.                           - Diverticulosis in the sigmoid colon.                           - Internal hemorrhoids.                           - No specimens collected. Moderate Sedation:      Moderate (conscious) sedation was administered by the endoscopy nurse       and supervised by the endoscopist. The following parameters were       monitored: oxygen saturation, heart rate, blood  pressure, CO2       capnography and response to care. Total physician intraservice time was       32 minutes. Recommendation:           - Patient has a contact number available for                            emergencies. The signs and symptoms of potential                            delayed complications were discussed with the                            patient. Return to normal activities tomorrow.                            Written discharge instructions were provided to the                            patient.                           - High fiber diet today.                           - Continue present medications.                           - Resume aspirin at prior dose today.                           - Repeat colonoscopy in 5 years for surveillance. Procedure Code(s):        --- Professional ---  45378, Colonoscopy, flexible; diagnostic, including                            collection of specimen(s) by brushing or washing,                            when performed (separate procedure)                           G0500, Moderate sedation services provided by the                            same physician or other qualified health care                            professional performing a gastrointestinal                            endoscopic service that sedation supports,                            requiring the presence of an independent trained                            observer to assist in the monitoring of the                            patient's level of consciousness and physiological                            status; initial 15 minutes of intra-service time;                            patient age 27 years or older (additional time may                            be reported with [phone], as appropriate)                           [phone], Moderate sedation services provided by the                            same physician or other qualified health care                             professional performing the diagnostic or                            therapeutic service that the sedation supports,                            requiring the presence of an independent trained                            observer  to assist in the monitoring of the                            patient's level of consciousness and physiological                            status; each additional 15 minutes intraservice                            time (List separately in addition to code for                            primary service) Diagnosis Code(s):        --- Professional ---                           Z86.010, Personal history of colonic polyps                           K64.8, Other hemorrhoids                           Z80.0, Family history of malignant neoplasm of                            digestive organs                           K57.30, Diverticulosis of large intestine without                            perforation or abscess without bleeding CPT copyright 2017 American [employer]. All rights reserved. The codes documented in this report are preliminary and upon coder review may  be revised to meet current compliance requirements. Hildred Laser, MD Hildred Laser, MD 05/12/2018 9:58:48 AM This report has been signed electronically. Number of Addenda: 0

## 2018-05-12 NOTE — Discharge Instructions (Signed)
Resume usual medications as before. High fiber diet. No driving for 24 hours. Next colonoscopy in 5 years.   Colonoscopy, Adult, Care After This sheet gives you information about how to care for yourself after your procedure. Your health care provider may also give you more specific instructions. If you have problems or questions, contact your health care provider. What can I expect after the procedure? After the procedure, it is common to have:  A small amount of blood in your stool for 24 hours after the procedure.  Some gas.  Mild abdominal cramping or bloating.  Follow these instructions at home: General instructions   For the first 24 hours after the procedure: ? Do not drive or use machinery. ? Do not sign important documents. ? Do not drink alcohol. ? Do your regular daily activities at a slower pace than normal. ? Eat soft, easy-to-digest foods. ? Rest often.  Take over-the-counter or prescription medicines only as told by your health care provider.  It is up to you to get the results of your procedure. Ask your health care provider, or the department performing the procedure, when your results will be ready. Relieving cramping and bloating  Try walking around when you have cramps or feel bloated.  Apply heat to your abdomen as told by your health care provider. Use a heat source that your health care provider recommends, such as a moist heat pack or a heating pad. ? Place a towel between your skin and the heat source. ? Leave the heat on for 20-30 minutes. ? Remove the heat if your skin turns bright red. This is especially important if you are unable to feel pain, heat, or cold. You may have a greater risk of getting burned. Eating and drinking  Drink enough fluid to keep your urine clear or pale yellow.  Resume your normal diet as instructed by your health care provider. Avoid heavy or fried foods that are hard to digest.  Avoid drinking alcohol for as long as  instructed by your health care provider. Contact a health care provider if:  You have blood in your stool 2-3 days after the procedure. Get help right away if:  You have more than a small spotting of blood in your stool.  You pass large blood clots in your stool.  Your abdomen is swollen.  You have nausea or vomiting.  You have a fever.  You have increasing abdominal pain that is not relieved with medicine. This information is not intended to replace advice given to you by your health care provider. Make sure you discuss any questions you have with your health care provider.   High-Fiber Diet Fiber, also called dietary fiber, is a type of carbohydrate found in fruits, vegetables, whole grains, and beans. A high-fiber diet can have many health benefits. Your health care provider may recommend a high-fiber diet to help:  Prevent constipation. Fiber can make your bowel movements more regular.  Lower your cholesterol.  Relieve hemorrhoids, uncomplicated diverticulosis, or irritable bowel syndrome.  Prevent overeating as part of a weight-loss plan.  Prevent heart disease, type 2 diabetes, and certain cancers.  What is my plan? The recommended daily intake of fiber includes:  38 grams for men under age 39.  41 grams for men over age 65.  39 grams for women under age 44.  76 grams for women over age 33.  You can get the recommended daily intake of dietary fiber by eating a variety of fruits, vegetables, grains,  and beans. Your health care provider may also recommend a fiber supplement if it is not possible to get enough fiber through your diet. What do I need to know about a high-fiber diet?  Fiber supplements have not been widely studied for their effectiveness, so it is better to get fiber through food sources.  Always check the fiber content on thenutrition facts label of any prepackaged food. Look for foods that contain at least 5 grams of fiber per serving.  Ask your  dietitian if you have questions about specific foods that are related to your condition, especially if those foods are not listed in the following section.  Increase your daily fiber consumption gradually. Increasing your intake of dietary fiber too quickly may cause bloating, cramping, or gas.  Drink plenty of water. Water helps you to digest fiber. What foods can I eat? Grains Whole-grain breads. Multigrain cereal. Oats and oatmeal. Brown rice. Barley. Bulgur wheat. [place]. Bran muffins. Popcorn. Rye wafer crackers. Vegetables Sweet potatoes. Spinach. Kale. Artichokes. Cabbage. Broccoli. Green peas. Carrots. Squash. Fruits Berries. Pears. Apples. Oranges. Avocados. Prunes and raisins. Dried figs. Meats and Other Protein Sources [place], kidney, pinto, and soy beans. Split peas. Lentils. Nuts and seeds. Dairy Fiber-fortified yogurt. Beverages Fiber-fortified soy milk. Fiber-fortified orange juice. Other Fiber bars. The items listed above may not be a complete list of recommended foods or beverages. Contact your dietitian for more options. What foods are not recommended? Grains White bread. Pasta made with refined flour. White rice. Vegetables Fried potatoes. Canned vegetables. Well-cooked vegetables. Fruits Fruit juice. Cooked, strained fruit. Meats and Other Protein Sources Fatty cuts of meat. Fried Sales executive or fried fish. Dairy Milk. Yogurt. Cream cheese. Sour cream. Beverages Soft drinks. Other Cakes and pastries. Butter and oils. The items listed above may not be a complete list of foods and beverages to avoid. Contact your dietitian for more information. What are some tips for including high-fiber foods in my diet?  Eat a wide variety of high-fiber foods.  Make sure that half of all grains consumed each day are whole grains.  Replace breads and cereals made from refined flour or white flour with whole-grain breads and cereals.  Replace white rice with brown rice, bulgur  wheat, or millet.  Start the day with a breakfast that is high in fiber, such as a cereal that contains at least 5 grams of fiber per serving.  Use beans in place of meat in soups, salads, or pasta.  Eat high-fiber snacks, such as berries, raw vegetables, nuts, or popcorn. This information is not intended to replace advice given to you by your health care provider. Make sure you discuss any questions you have with your health care provider.

## 2018-05-13 DIAGNOSIS — C8400 Mycosis fungoides, unspecified site: Secondary | ICD-10-CM | POA: Diagnosis not present

## 2018-05-14 ENCOUNTER — Encounter (HOSPITAL_COMMUNITY): Payer: Self-pay | Admitting: Internal Medicine

## 2018-05-18 DIAGNOSIS — C8400 Mycosis fungoides, unspecified site: Secondary | ICD-10-CM | POA: Diagnosis not present

## 2018-05-21 DIAGNOSIS — C8400 Mycosis fungoides, unspecified site: Secondary | ICD-10-CM | POA: Diagnosis not present

## 2018-05-24 DIAGNOSIS — C8400 Mycosis fungoides, unspecified site: Secondary | ICD-10-CM | POA: Diagnosis not present

## 2018-05-27 DIAGNOSIS — C8400 Mycosis fungoides, unspecified site: Secondary | ICD-10-CM | POA: Diagnosis not present

## 2018-05-31 DIAGNOSIS — C8400 Mycosis fungoides, unspecified site: Secondary | ICD-10-CM | POA: Diagnosis not present

## 2018-06-03 DIAGNOSIS — C8400 Mycosis fungoides, unspecified site: Secondary | ICD-10-CM | POA: Diagnosis not present

## 2018-06-07 DIAGNOSIS — C8400 Mycosis fungoides, unspecified site: Secondary | ICD-10-CM | POA: Diagnosis not present

## 2018-06-10 DIAGNOSIS — C8400 Mycosis fungoides, unspecified site: Secondary | ICD-10-CM | POA: Diagnosis not present

## 2018-06-14 DIAGNOSIS — C8400 Mycosis fungoides, unspecified site: Secondary | ICD-10-CM | POA: Diagnosis not present

## 2018-06-15 DIAGNOSIS — C8400 Mycosis fungoides, unspecified site: Secondary | ICD-10-CM | POA: Diagnosis not present

## 2018-06-16 DIAGNOSIS — K219 Gastro-esophageal reflux disease without esophagitis: Secondary | ICD-10-CM | POA: Diagnosis not present

## 2018-06-16 DIAGNOSIS — E785 Hyperlipidemia, unspecified: Secondary | ICD-10-CM | POA: Diagnosis not present

## 2018-06-16 DIAGNOSIS — I10 Essential (primary) hypertension: Secondary | ICD-10-CM | POA: Diagnosis not present

## 2018-06-17 DIAGNOSIS — C8400 Mycosis fungoides, unspecified site: Secondary | ICD-10-CM | POA: Diagnosis not present

## 2018-06-21 DIAGNOSIS — C8400 Mycosis fungoides, unspecified site: Secondary | ICD-10-CM | POA: Diagnosis not present

## 2018-06-28 DIAGNOSIS — C8400 Mycosis fungoides, unspecified site: Secondary | ICD-10-CM | POA: Diagnosis not present

## 2018-07-01 DIAGNOSIS — C8400 Mycosis fungoides, unspecified site: Secondary | ICD-10-CM | POA: Diagnosis not present

## 2018-07-05 DIAGNOSIS — C8400 Mycosis fungoides, unspecified site: Secondary | ICD-10-CM | POA: Diagnosis not present

## 2018-07-08 DIAGNOSIS — C8400 Mycosis fungoides, unspecified site: Secondary | ICD-10-CM | POA: Diagnosis not present

## 2018-07-12 DIAGNOSIS — C8400 Mycosis fungoides, unspecified site: Secondary | ICD-10-CM | POA: Diagnosis not present

## 2018-07-15 DIAGNOSIS — C8400 Mycosis fungoides, unspecified site: Secondary | ICD-10-CM | POA: Diagnosis not present

## 2018-07-19 DIAGNOSIS — C8400 Mycosis fungoides, unspecified site: Secondary | ICD-10-CM | POA: Diagnosis not present

## 2018-07-21 ENCOUNTER — Encounter (INDEPENDENT_AMBULATORY_CARE_PROVIDER_SITE_OTHER): Payer: Self-pay | Admitting: Internal Medicine

## 2018-07-21 ENCOUNTER — Encounter (INDEPENDENT_AMBULATORY_CARE_PROVIDER_SITE_OTHER): Payer: Self-pay | Admitting: *Deleted

## 2018-07-21 ENCOUNTER — Ambulatory Visit (INDEPENDENT_AMBULATORY_CARE_PROVIDER_SITE_OTHER): Payer: 59 | Admitting: Internal Medicine

## 2018-07-21 VITALS — BP 140/80 | HR 72 | Temp 97.8°F | Ht 71.0 in | Wt 208.9 lb

## 2018-07-21 DIAGNOSIS — K219 Gastro-esophageal reflux disease without esophagitis: Secondary | ICD-10-CM | POA: Insufficient documentation

## 2018-07-21 NOTE — Patient Instructions (Signed)
The risks of bleeding, perforation and infection were reviewed with patient.  

## 2018-07-21 NOTE — Progress Notes (Signed)
   Subjective:    Patient ID: Scott Mathis, male    DOB: [birth year], 62 y.o.   MRN: [id number]  HPI Presents today with c/o GERD. States he was having a dry cough and heart burn, burping. Started on Protonix by [place].  Symptoms for about 3 months.  Denies any hoarseness. He continues to have some GERD. Symptoms are better. Still has a dry cough.  Has some tightness in his epigastric region. Appetite is good. Weight loss which was intentional  Hx of lymphoma and followed by Dr. Irish Elders at [facility].  Colonoscopy in May of this year: Family hx of colon cancer, hx of polyps. Colonoscopy: diverticulosis. Internal hemorrhoids.     Review of Systems      Past Medical History:  Diagnosis Date  . Colon polyps   . GERD (gastroesophageal reflux disease)   . Hypertension   . Lymphoma ([place])   . Stroke [facility])     Past Surgical History:  Procedure Laterality Date  . Colonoscopy    . COLONOSCOPY    . COLONOSCOPY    . COLONOSCOPY N/A 05/12/2018   Procedure: COLONOSCOPY;  Surgeon: Rogene Houston, MD;  Location: [facility];  Service: Endoscopy;  Laterality: N/A;  930  . Right shoulder surgery    . TONSILLECTOMY      No Known Allergies  Current Outpatient Medications on File Prior to Visit  Medication Sig Dispense Refill  . amLODipine (NORVASC) 10 MG tablet Take 10 mg by mouth daily.     Marland Kitchen aspirin 325 MG tablet Take 1 tablet (325 mg total) by mouth daily. 30 tablet 0  . atorvastatin (LIPITOR) 20 MG tablet Take 1 tablet (20 mg total) by mouth daily at 6 PM. 30 tablet 0  . Multiple Vitamin (MULTI-VITAMINS) TABS Take 1 tablet by mouth daily.     . naproxen sodium (ALEVE) 220 MG tablet Take 440 mg by mouth daily as needed (pain).    . Omega-3 Fatty Acids (FISH OIL) 1000 MG CAPS Take 1,000 mg by mouth daily.     . pantoprazole (PROTONIX) 40 MG tablet Take 40 mg by mouth daily.  1  . mupirocin ointment (BACTROBAN) 2 % Apply 1 application topically daily.  0   No current facility-administered  medications on file prior to visit.         Objective:   Physical Exam Blood pressure 140/80, pulse 72, temperature 97.8 F (36.6 C), height 5\' 11"  (1.803 m), weight 208 lb 14.4 oz (94.8 kg).  Alert and oriented. Skin warm and dry. Oral mucosa is moist.   . Sclera anicteric, conjunctivae is pink. Thyroid not enlarged. No cervical lymphadenopathy. Lungs clear. Heart regular rate and rhythm.  Abdomen is soft. Bowel sounds are positive. No hepatomegaly. No abdominal masses felt. No tenderness.  No edema to lower extremities.          Assessment & Plan:  GERD.  EGD.  The risks of bleeding, perforation and infection were reviewed with patient. Increase Protonix 40mg  BID.

## 2018-07-22 DIAGNOSIS — C8400 Mycosis fungoides, unspecified site: Secondary | ICD-10-CM | POA: Diagnosis not present

## 2018-07-26 DIAGNOSIS — C8400 Mycosis fungoides, unspecified site: Secondary | ICD-10-CM | POA: Diagnosis not present

## 2018-07-29 DIAGNOSIS — C8400 Mycosis fungoides, unspecified site: Secondary | ICD-10-CM | POA: Diagnosis not present

## 2018-08-02 DIAGNOSIS — C8400 Mycosis fungoides, unspecified site: Secondary | ICD-10-CM | POA: Diagnosis not present

## 2018-08-19 DIAGNOSIS — I10 Essential (primary) hypertension: Secondary | ICD-10-CM | POA: Diagnosis not present

## 2018-08-19 DIAGNOSIS — K219 Gastro-esophageal reflux disease without esophagitis: Secondary | ICD-10-CM | POA: Diagnosis not present

## 2018-08-19 DIAGNOSIS — C8400 Mycosis fungoides, unspecified site: Secondary | ICD-10-CM | POA: Diagnosis not present

## 2018-08-19 DIAGNOSIS — Z0000 Encounter for general adult medical examination without abnormal findings: Secondary | ICD-10-CM | POA: Diagnosis not present

## 2018-08-19 DIAGNOSIS — R5383 Other fatigue: Secondary | ICD-10-CM | POA: Diagnosis not present

## 2018-08-19 DIAGNOSIS — E785 Hyperlipidemia, unspecified: Secondary | ICD-10-CM | POA: Diagnosis not present

## 2018-08-26 DIAGNOSIS — C8400 Mycosis fungoides, unspecified site: Secondary | ICD-10-CM | POA: Diagnosis not present

## 2018-08-30 DIAGNOSIS — C8400 Mycosis fungoides, unspecified site: Secondary | ICD-10-CM | POA: Diagnosis not present

## 2018-09-02 DIAGNOSIS — C8400 Mycosis fungoides, unspecified site: Secondary | ICD-10-CM | POA: Diagnosis not present

## 2018-09-06 DIAGNOSIS — C8400 Mycosis fungoides, unspecified site: Secondary | ICD-10-CM | POA: Diagnosis not present

## 2018-09-09 ENCOUNTER — Encounter (HOSPITAL_COMMUNITY)
Admission: RE | Disposition: A | Discharge status: Home / Self Care | Payer: Self-pay | Source: Ambulatory Visit | Attending: Internal Medicine

## 2018-09-09 ENCOUNTER — Encounter (HOSPITAL_COMMUNITY): Payer: Self-pay | Admitting: *Deleted

## 2018-09-09 ENCOUNTER — Ambulatory Visit (HOSPITAL_COMMUNITY)
Admission: RE | Admit: 2018-09-09 | Discharge: 2018-09-09 | Disposition: A | Discharge status: Home / Self Care | Payer: 59 | Source: Ambulatory Visit | Attending: Internal Medicine | Admitting: Internal Medicine

## 2018-09-09 ENCOUNTER — Other Ambulatory Visit: Payer: Self-pay

## 2018-09-09 DIAGNOSIS — K3189 Other diseases of stomach and duodenum: Secondary | ICD-10-CM

## 2018-09-09 DIAGNOSIS — K317 Polyp of stomach and duodenum: Secondary | ICD-10-CM | POA: Insufficient documentation

## 2018-09-09 DIAGNOSIS — Z79899 Other long term (current) drug therapy: Secondary | ICD-10-CM | POA: Insufficient documentation

## 2018-09-09 DIAGNOSIS — Z8673 Personal history of transient ischemic attack (TIA), and cerebral infarction without residual deficits: Secondary | ICD-10-CM | POA: Insufficient documentation

## 2018-09-09 DIAGNOSIS — K449 Diaphragmatic hernia without obstruction or gangrene: Secondary | ICD-10-CM | POA: Insufficient documentation

## 2018-09-09 DIAGNOSIS — I10 Essential (primary) hypertension: Secondary | ICD-10-CM | POA: Diagnosis not present

## 2018-09-09 DIAGNOSIS — K2960 Other gastritis without bleeding: Secondary | ICD-10-CM

## 2018-09-09 DIAGNOSIS — K219 Gastro-esophageal reflux disease without esophagitis: Secondary | ICD-10-CM | POA: Insufficient documentation

## 2018-09-09 DIAGNOSIS — K228 Other specified diseases of esophagus: Secondary | ICD-10-CM | POA: Diagnosis not present

## 2018-09-09 DIAGNOSIS — Z7982 Long term (current) use of aspirin: Secondary | ICD-10-CM | POA: Diagnosis not present

## 2018-09-09 DIAGNOSIS — C8400 Mycosis fungoides, unspecified site: Secondary | ICD-10-CM | POA: Diagnosis not present

## 2018-09-09 HISTORY — PX: ESOPHAGOGASTRODUODENOSCOPY: SHX5428

## 2018-09-09 HISTORY — PX: BIOPSY: SHX5522

## 2018-09-09 SURGERY — EGD (ESOPHAGOGASTRODUODENOSCOPY)
Anesthesia: Moderate Sedation

## 2018-09-09 MED ORDER — SODIUM CHLORIDE 0.9 % IV SOLN
INTRAVENOUS | Status: DC
  Administered 2018-09-09: 14:00:00 via INTRAVENOUS

## 2018-09-09 MED ORDER — LIDOCAINE VISCOUS HCL 2 % MT SOLN
OROMUCOSAL | Status: AC
  Filled 2018-09-09: qty 15

## 2018-09-09 MED ORDER — MIDAZOLAM HCL 5 MG/5ML IJ SOLN
INTRAMUSCULAR | Status: DC | PRN
  Administered 2018-09-09 (×3): 2 mg via INTRAVENOUS

## 2018-09-09 MED ORDER — MEPERIDINE HCL 50 MG/ML IJ SOLN
INTRAMUSCULAR | Status: AC
  Filled 2018-09-09: qty 1

## 2018-09-09 MED ORDER — MIDAZOLAM HCL 5 MG/5ML IJ SOLN
INTRAMUSCULAR | Status: AC
  Filled 2018-09-09: qty 10

## 2018-09-09 MED ORDER — MEPERIDINE HCL 50 MG/ML IJ SOLN
INTRAMUSCULAR | Status: DC | PRN
  Administered 2018-09-09 (×2): 25 mg via INTRAVENOUS

## 2018-09-09 MED ORDER — LIDOCAINE VISCOUS HCL 2 % MT SOLN
OROMUCOSAL | Status: DC | PRN
  Administered 2018-09-09: 4 mL via OROMUCOSAL

## 2018-09-09 MED ORDER — STERILE WATER FOR IRRIGATION IR SOLN
Status: DC | PRN
  Administered 2018-09-09: 15:00:00

## 2018-09-09 NOTE — Op Note (Signed)
[facility] Patient Name: Scott Mathis Procedure Date: 09/09/2018 2:55 PM MRN: [id number] Date of Birth: 10/10/1956 Attending MD: Hildred Laser , MD CSN: [id number] Age: 62 Admit Type: Outpatient Procedure:                Upper GI endoscopy Indications:              Follow-up of gastro-esophageal reflux disease Providers:                Hildred Laser, MD, Otis Peak B. Sharon Seller, RN, Randa Spike, Technician Referring MD:             Doree Albee, MD Medicines:                Lidocaine spray, Meperidine 50 mg IV, Midazolam 6                            mg IV Complications:            No immediate complications. Estimated Blood Loss:     Estimated blood loss was minimal. Procedure:                Pre-Anesthesia Assessment:                           - Prior to the procedure, a History and Physical                            was performed, and patient medications and                            allergies were reviewed. The patient's tolerance of                            previous anesthesia was also reviewed. The risks                            and benefits of the procedure and the sedation                            options and risks were discussed with the patient.                            All questions were answered, and informed consent                            was obtained. Prior Anticoagulants: The patient                            last took aspirin on the day of the procedure. ASA                            Grade Assessment: II - A patient with mild systemic  disease. After reviewing the risks and benefits,                            the patient was deemed in satisfactory condition to                            undergo the procedure.                           After obtaining informed consent, the endoscope was                            passed under direct vision. Throughout the   procedure, the patient's blood pressure, pulse, and                            oxygen saturations were monitored continuously. The                            [id number]-[id number] ([id number]) scope was introduced through the                            mouth, and advanced to the second part of duodenum.                            The upper GI endoscopy was accomplished without                            difficulty. The patient tolerated the procedure                            well. Scope In: 3:21:40 PM Scope Out: 3:35:09 PM Total Procedure Duration: 0 hours 13 minutes 29 seconds  Findings:      The examined esophagus was normal.      The Z-line was irregular and was found 39 cm from the incisors.      A 3 cm hiatal hernia was present.      A few small sessile polyps with no stigmata of recent bleeding were       found in the gastric fundus and in the gastric body. Biopsies were taken       with a cold forceps for histology. The pathology specimen was placed       into Bottle Number 1.      A few erosions were found in the prepyloric region of the stomach.      The exam of the stomach was otherwise normal.      The duodenal bulb and second portion of the duodenum were normal. Impression:               - Normal esophagus.                           - Z-line irregular, 39 cm from the incisors.                           - 3 cm hiatal hernia.                           -  A few gastric polyps. Biopsied.                           - Erosive gastropathy.                           - Normal duodenal bulb and second portion of the                            duodenum. Moderate Sedation:      Moderate (conscious) sedation was administered by the endoscopy nurse       and supervised by the endoscopist. The following parameters were       monitored: oxygen saturation, heart rate, blood pressure, CO2       capnography and response to care. Total physician intraservice time was       19 minutes. Recommendation:            - Patient has a contact number available for                            emergencies. The signs and symptoms of potential                            delayed complications were discussed with the                            patient. Return to normal activities tomorrow.                            Written discharge instructions were provided to the                            patient.                           - Resume previous diet today.                           - Continue present medications.                           - Resume aspirin at prior dose tomorrow.                           - Keep Aleve use to minimum.                           - Await pathology results.                           - Perform an H. pylori serology today. Procedure Code(s):        --- Professional ---                           [phone], Esophagogastroduodenoscopy, flexible,  transoral; with biopsy, single or multiple                           G0500, Moderate sedation services provided by the                            same physician or other qualified health care                            professional performing a gastrointestinal                            endoscopic service that sedation supports,                            requiring the presence of an independent trained                            observer to assist in the monitoring of the                            patient's level of consciousness and physiological                            status; initial 15 minutes of intra-service time;                            patient age 74 years or older (additional time may                            be reported with [phone], as appropriate) Diagnosis Code(s):        --- Professional ---                           K22.8, Other specified diseases of esophagus                           K44.9, Diaphragmatic hernia without obstruction or                            gangrene                            K31.7, Polyp of stomach and duodenum                           K31.89, Other diseases of stomach and duodenum                           K21.9, Gastro-esophageal reflux disease without                            esophagitis CPT copyright 2017 American Medical Association. All rights reserved. The codes documented in this report are preliminary and upon coder review may  be revised to meet current compliance requirements.  Hildred Laser, MD Hildred Laser, MD 09/09/2018 3:45:09 PM This report has been signed electronically. Number of Addenda: 0

## 2018-09-09 NOTE — H&P (Addendum)
Scott Mathis is an 62 y.o. male.   Chief Complaint: Patient is here for EGD. HPI: Patient is 62 year old Caucasian male who is been experiencing heartburn coughing while eating as well as frequent burping for the last 6 months or so.  She did not have satisfactory results with pantoprazole.  He was seen in the office 6 weeks ago when PPI dose was doubled.  He states he is better but not to his satisfaction.  He still has some coughing spells as well as heartburn at least twice a week and frequent burping.  He denies nausea vomiting or dysphagia.  He also denies involuntary weight loss or melena.  He is on full dose aspirin.  He takes Aleve occasionally for headache.  He has been watching his diet.  He does not smoke cigarettes or drink alcohol.  Past Medical History:  Diagnosis Date  . Colon polyps   . GERD (gastroesophageal reflux disease)   . Hypertension   . Lymphoma ([place])   . Stroke [facility])     Past Surgical History:  Procedure Laterality Date  . Colonoscopy    . COLONOSCOPY    . COLONOSCOPY    . COLONOSCOPY N/A 05/12/2018   Procedure: COLONOSCOPY;  Surgeon: Rogene Houston, MD;  Location: [facility];  Service: Endoscopy;  Laterality: N/A;  930  . Right shoulder surgery    . TONSILLECTOMY      Family History  Problem Relation Age of Onset  . Colon cancer Maternal Aunt   . Colon cancer Maternal Grandfather    Social History:  reports that he has never smoked. He has never used smokeless tobacco. He reports that he does not drink alcohol or use drugs.  Allergies: No Known Allergies  Medications Prior to Admission  Medication Sig Dispense Refill  . amLODipine (NORVASC) 5 MG tablet Take 5 mg by mouth daily.     Marland Kitchen aspirin 325 MG tablet Take 1 tablet (325 mg total) by mouth daily. 30 tablet 0  . atorvastatin (LIPITOR) 20 MG tablet Take 1 tablet (20 mg total) by mouth daily at 6 PM. 30 tablet 0  . Multiple Vitamin (MULTI-VITAMINS) TABS Take 1 tablet by mouth daily.     .  naproxen sodium (ALEVE) 220 MG tablet Take 440 mg by mouth daily as needed (pain).    . Omega-3 Fatty Acids (FISH OIL) 1000 MG CAPS Take 1,000 mg by mouth daily.     . pantoprazole (PROTONIX) 40 MG tablet Take 40 mg by mouth 2 (two) times daily.   1  . triamcinolone cream (KENALOG) 0.1 % Apply 1 application topically See admin instructions. Apply to affected area once daily for 2 weeks at a time, taking 1 week off in between      No results found for this or any previous visit (from the past 48 hour(s)). No results found.  ROS  Blood pressure 140/84, pulse 66, temperature 97.6 F (36.4 C), temperature source Oral, resp. rate 16, SpO2 98 %. Physical Exam  Constitutional: He appears well-developed and well-nourished.  HENT:  Mouth/Throat: Oropharynx is clear and moist.  Eyes: Conjunctivae are normal. No scleral icterus.  Neck: No thyromegaly present.  Cardiovascular: Normal rate, regular rhythm and normal heart sounds.  No murmur heard. Respiratory: Effort normal and breath sounds normal.  GI: Soft. He exhibits no distension and no mass. There is no tenderness.  Musculoskeletal: He exhibits no edema.  Lymphadenopathy:    He has no cervical adenopathy.  Neurological: He is  alert.  Skin: Skin is warm and dry.     Assessment/Plan GERD with satisfactory response to therapy. Diagnostic EGD.  Hildred Laser, MD 09/09/2018, 3:11 PM

## 2018-09-09 NOTE — Discharge Instructions (Signed)
Do not take Aleve for next 3 days.  Keep use minimum. Resume other medications as before. Resume usual diet. Antireflux measures. No driving for 24 hours. Physician will call with results of biopsy and blood test.  Gastrointestinal Endoscopy, Care After Refer to this sheet in the next few weeks. These instructions provide you with information about caring for yourself after your procedure. Your health care provider may also give you more specific instructions. Your treatment has been planned according to current medical practices, but problems sometimes occur. Call your health care provider if you have any problems or questions after your procedure. What can I expect after the procedure? After your procedure, it is common to feel:  Bloated.  Soreness in your throat.  Sleepy.  Follow these instructions at home:  Do not drive for 24 hours if you received a if you received a medicine to help you relax (sedative).  Avoid drinking warm beverages and alcohol for the first 24 hours after the procedure.  Take over-the-counter and prescription medicines only as told by your health care provider.  Drink enough fluids to keep your urine clear or pale yellow.  If you feel bloated, try going for a walk. Walking may help the feeling go away.  If your throat is sore, try gargling with salt water. Get help right away if:  You have severe nausea or vomiting.  You have severe abdominal pain, abdominal cramps that last longer than 6 hours, or abdominal swelling.  You have severe shoulder or back pain.  You have trouble swallowing.  You have shortness of breath, your breathing is shallow, or you breathing is faster than normal.  You have a fever.  Your heart is beating very fast.  You vomit blood or material that looks like coffee grounds.  You have bloody, black, or tarry stools. This information is not intended to replace advice given to you by your health care provider. Make sure you  discuss any questions you have with your health care provider.  Gastroesophageal Reflux Disease, Adult Normally, food travels down the esophagus and stays in the stomach to be digested. However, when a person has gastroesophageal reflux disease (GERD), food and stomach acid move back up into the esophagus. When this happens, the esophagus becomes sore and inflamed. Over time, GERD can create small holes (ulcers) in the lining of the esophagus. What are the causes? This condition is caused by a problem with the muscle between the esophagus and the stomach (lower esophageal sphincter, or LES). Normally, the LES muscle closes after food passes through the esophagus to the stomach. When the LES is weakened or abnormal, it does not close properly, and that allows food and stomach acid to go back up into the esophagus. The LES can be weakened by certain dietary substances, medicines, and medical conditions, including:  Tobacco use.  Pregnancy.  Having a hiatal hernia.  Heavy alcohol use.  Certain foods and beverages, such as coffee, chocolate, onions, and peppermint.  What increases the risk? This condition is more likely to develop in:  People who have an increased body weight.  People who have connective tissue disorders.  People who use NSAID medicines.  What are the signs or symptoms? Symptoms of this condition include:  Heartburn.  Difficult or painful swallowing.  The feeling of having a lump in the throat.  Abitter taste in the mouth.  Bad breath.  Having a large amount of saliva.  Having an upset or bloated stomach.  Belching.  Chest  pain.  Shortness of breath or wheezing.  Ongoing (chronic) cough or a night-time cough.  Wearing away of tooth enamel.  Weight loss.  Different conditions can cause chest pain. Make sure to see your health care provider if you experience chest pain. How is this diagnosed? Your health care provider will take a medical history  and perform a physical exam. To determine if you have mild or severe GERD, your health care provider may also monitor how you respond to treatment. You may also have other tests, including:  An endoscopy toexamine your stomach and esophagus with a small camera.  A test thatmeasures the acidity level in your esophagus.  A test thatmeasures how much pressure is on your esophagus.  A barium swallow or modified barium swallow to show the shape, size, and functioning of your esophagus.  How is this treated? The goal of treatment is to help relieve your symptoms and to prevent complications. Treatment for this condition may vary depending on how severe your symptoms are. Your health care provider may recommend:  Changes to your diet.  Medicine.  Surgery.  Follow these instructions at home: Diet  Follow a diet as recommended by your health care provider. This may involve avoiding foods and drinks such as: ? Coffee and tea (with or without caffeine). ? Drinks that containalcohol. ? Energy drinks and sports drinks. ? Carbonated drinks or sodas. ? Chocolate and cocoa. ? Peppermint and mint flavorings. ? Garlic and onions. ? Horseradish. ? Spicy and acidic foods, including peppers, chili powder, curry powder, vinegar, hot sauces, and barbecue sauce. ? Citrus fruit juices and citrus fruits, such as oranges, lemons, and limes. ? Tomato-based foods, such as red sauce, chili, salsa, and pizza with red sauce. ? Fried and fatty foods, such as donuts, french fries, potato chips, and high-fat dressings. ? High-fat meats, such as hot dogs and fatty cuts of red and white meats, such as rib eye steak, sausage, ham, and bacon. ? High-fat dairy items, such as whole milk, butter, and cream cheese.  Eat small, frequent meals instead of large meals.  Avoid drinking large amounts of liquid with your meals.  Avoid eating meals during the 2-3 hours before bedtime.  Avoid lying down right after you  eat.  Do not exercise right after you eat. General instructions  Pay attention to any changes in your symptoms.  Take over-the-counter and prescription medicines only as told by your health care provider. Do not take aspirin, ibuprofen, or other NSAIDs unless your health care provider told you to do so.  Do not use any tobacco products, including cigarettes, chewing tobacco, and e-cigarettes. If you need help quitting, ask your health care provider.  Wear loose-fitting clothing. Do not wear anything tight around your waist that causes pressure on your abdomen.  Raise (elevate) the head of your bed 6 inches (15cm).  Try to reduce your stress, such as with yoga or meditation. If you need help reducing stress, ask your health care provider.  If you are overweight, reduce your weight to an amount that is healthy for you. Ask your health care provider for guidance about a safe weight loss goal.  Keep all follow-up visits as told by your health care provider. This is important. Contact a health care provider if:  You have new symptoms.  You have unexplained weight loss.  You have difficulty swallowing, or it hurts to swallow.  You have wheezing or a persistent cough.  Your symptoms do not improve with treatment.  You have a hoarse voice. Get help right away if:  You have pain in your arms, neck, jaw, teeth, or back.  You feel sweaty, dizzy, or light-headed.  You have chest pain or shortness of breath.  You vomit and your vomit looks like blood or coffee grounds.  You faint.  Your stool is bloody or black.  You cannot swallow, drink, or eat. This information is not intended to replace advice given to you by your health care provider. Make sure you discuss any questions you have with your health care provider.

## 2018-09-10 LAB — H. PYLORI ANTIBODY, IGG: H Pylori IgG: 0.16 Index Value (ref 0.00–0.79)

## 2018-09-13 DIAGNOSIS — C8400 Mycosis fungoides, unspecified site: Secondary | ICD-10-CM | POA: Diagnosis not present

## 2018-09-14 ENCOUNTER — Other Ambulatory Visit (INDEPENDENT_AMBULATORY_CARE_PROVIDER_SITE_OTHER): Payer: Self-pay | Admitting: Internal Medicine

## 2018-09-14 ENCOUNTER — Encounter (HOSPITAL_COMMUNITY): Payer: Self-pay | Admitting: Internal Medicine

## 2018-09-14 MED ORDER — DEXLANSOPRAZOLE 60 MG PO CPDR: 60.0000 mg | DELAYED_RELEASE_CAPSULE | Freq: Every day | ORAL | 5 refills | Status: DC

## 2018-09-16 DIAGNOSIS — C8400 Mycosis fungoides, unspecified site: Secondary | ICD-10-CM | POA: Diagnosis not present

## 2018-09-20 DIAGNOSIS — C8400 Mycosis fungoides, unspecified site: Secondary | ICD-10-CM | POA: Diagnosis not present

## 2018-09-23 DIAGNOSIS — C8400 Mycosis fungoides, unspecified site: Secondary | ICD-10-CM | POA: Diagnosis not present

## 2018-09-27 DIAGNOSIS — C8400 Mycosis fungoides, unspecified site: Secondary | ICD-10-CM | POA: Diagnosis not present

## 2018-09-28 DIAGNOSIS — C8400 Mycosis fungoides, unspecified site: Secondary | ICD-10-CM | POA: Diagnosis not present

## 2018-09-29 DIAGNOSIS — Z23 Encounter for immunization: Secondary | ICD-10-CM | POA: Diagnosis not present

## 2018-09-30 DIAGNOSIS — C8400 Mycosis fungoides, unspecified site: Secondary | ICD-10-CM | POA: Diagnosis not present

## 2018-10-01 DIAGNOSIS — R972 Elevated prostate specific antigen [PSA]: Secondary | ICD-10-CM | POA: Diagnosis not present

## 2018-10-08 DIAGNOSIS — C8400 Mycosis fungoides, unspecified site: Secondary | ICD-10-CM | POA: Diagnosis not present

## 2018-10-08 DIAGNOSIS — S46112A Strain of muscle, fascia and tendon of long head of biceps, left arm, initial encounter: Secondary | ICD-10-CM | POA: Diagnosis not present

## 2018-10-08 DIAGNOSIS — M25512 Pain in left shoulder: Secondary | ICD-10-CM | POA: Diagnosis not present

## 2018-10-13 ENCOUNTER — Ambulatory Visit: Payer: 59 | Admitting: Urology

## 2018-10-13 DIAGNOSIS — R972 Elevated prostate specific antigen [PSA]: Secondary | ICD-10-CM | POA: Diagnosis not present

## 2018-10-14 DIAGNOSIS — C8400 Mycosis fungoides, unspecified site: Secondary | ICD-10-CM | POA: Diagnosis not present

## 2018-10-19 ENCOUNTER — Ambulatory Visit: Payer: 59 | Admitting: Adult Health

## 2018-10-19 ENCOUNTER — Encounter: Payer: Self-pay | Admitting: Adult Health

## 2018-10-19 VITALS — BP 137/90 | HR 74 | Ht 71.0 in | Wt 215.4 lb

## 2018-10-19 DIAGNOSIS — I10 Essential (primary) hypertension: Secondary | ICD-10-CM

## 2018-10-19 DIAGNOSIS — E785 Hyperlipidemia, unspecified: Secondary | ICD-10-CM

## 2018-10-19 DIAGNOSIS — G459 Transient cerebral ischemic attack, unspecified: Secondary | ICD-10-CM

## 2018-10-19 NOTE — Progress Notes (Signed)
[facility] [address] [place]. [place] [address] [phone]       OFFICE FOLLOW-UP NOTE  [name] Date of Birth:  04/24/1956 Medical Record Number:  [id number]   HPI: Mr Mathis is a 62 year old Caucasian male seen today for the first office follow-up visit following hospital admission for T IA in February 2019. History is obtained from the patient and review of electronic medical records. 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 When he tried to walk, he felt like he was walking on a ship like a drunken sailor. His symptoms lasted several hours un and he gradually improved the next day.He denied any preceding neck injury. Denies any chiropractic maneuvers. Denies any head injury. Denies any similar symptoms in the past. Denies any preceding sicknesses or illnesses. Denies any drug use or alcohol use. Denies any headache or visual symptoms including diplopia or blurred vision. Denied focal tingling numbness or weakness. Denies slurred speech or aphasia. Denies chest pain, shortness of breath, cough, fevers, chills. He does report nausea but no vomiting. LKW: 10 PM on 01/29/2018 when he went to bed tpa given?: no, outside the window Premorbid modified Rankin scale (mRS):o CT scan of the head at [facility] suggested  Possibility of right cerebellar infarct and hence patient was transferred t. MRI of the brain however did not show any acute inf CT angiogram showed no significant stenosis in t posterior inferior cerebellar artery. Transthoracic echocardiogram  show cardiac source of embolism. lDL cholesterol was 90 mg percent and hemoglobin A1c was 5.2. Patient was started on aspirin for stroke prevention and Lipitor. He states he has done well since discharge and has not had any recurrent gait ataxia or any other new focal neurological symptoms.   He is tolerating Lipitor well without muscle aches or pains.he has no new complaints today.  Interval history 10/19/2018: Patient is being seen today for follow up appointment. He states overall he has been well. He denies recurrent episodes or symptoms of TIA/stroke. He continues to take aspirin 325mg  without side effects of bleeding or bruising. Continues to take lipitor without side effects of myalgias. Blood pressure satisfactory 104/63. Continues to stay active and maintains a healthy diet. No further concerns at this time. Denies new or worsening stroke/TIA symptoms.     ROS:   14 system review of systems is positive for  No complaints today and all systems negative PMH:  Past Medical History:  Diagnosis Date  . Colon polyps   . GERD (gastroesophageal reflux disease)   . Hypertension   . Lymphoma ([place])   . Stroke [facility])     Social History:  Social History   Socioeconomic History  . Marital status: Married    Spouse name: Not on file  . Number of children: Not on file  . Years of education: Not on file  . Highest education level: Not on file  Occupational History  . Not on file  Social Needs  . Financial resource strain: Not on file  . Food insecurity:    Worry: Not on file    Inability: Not on file  . Transportation  needs:    Medical: Not on file    Non-medical: Not on file  Tobacco Use  . Smoking status: Never Smoker  . Smokeless tobacco: Never Used  Substance and Sexual Activity  . Alcohol use: No    Frequency: Never  . Drug use: No  . Sexual activity: Not on file  Lifestyle  . Physical activity:    Days per week: Not on file    Minutes per session: Not on file  . Stress:  Not on file  Relationships  . Social connections:    Talks on phone: Not on file    Gets together: Not on file    Attends religious service: Not on file    Active member of club or organization: Not on file    Attends meetings of clubs or organizations: Not on file    Relationship status: Not on file  . Intimate partner violence:    Fear of current or ex partner: Not on file    Emotionally abused: Not on file    Physically abused: Not on file    Forced sexual activity: Not on file  Other Topics Concern  . Not on file  Social History Narrative  . Not on file    Medications:   Current Outpatient Medications on File Prior to Visit  Medication Sig Dispense Refill  . amLODipine (NORVASC) 5 MG tablet Take 5 mg by mouth daily.     Marland Kitchen aspirin 325 MG tablet Take 1 tablet (325 mg total) by mouth daily. 30 tablet 0  . atorvastatin (LIPITOR) 20 MG tablet Take 1 tablet (20 mg total) by mouth daily at 6 PM. 30 tablet 0  . dexlansoprazole (DEXILANT) 60 MG capsule Take 1 capsule (60 mg total) by mouth daily. 30 capsule 5  . Multiple Vitamin (MULTI-VITAMINS) TABS Take 1 tablet by mouth daily.     . Omega-3 Fatty Acids (FISH OIL) 1000 MG CAPS Take 1,000 mg by mouth daily.     Marland Kitchen spironolactone-hydrochlorothiazide (ALDACTAZIDE) 25-25 MG tablet Take by mouth.    . triamcinolone cream (KENALOG) 0.1 % Apply 1 application topically See admin instructions. Apply to affected area once daily for 2 weeks at a time, taking 1 week off in between     No current facility-administered medications on file prior to visit.     Allergies:  No Known Allergies  Physical Exam General: well developed, well nourished slightly overweight middle-aged Caucasian male, seated, in no evident distress Head: head normocephalic and atraumatic.  Neck: supple with no carotid or supraclavicular bruits Cardiovascular: regular rate and rhythm, no murmurs Musculoskeletal: no deformity Skin:  no rash/petichiae Vascular:  Normal  pulses all extremities There were no vitals filed for this visit. Neurologic Exam Mental Status: Awake and fully alert. Oriented to place and time. Recent and remote memory intact. Attention span, concentration and fund of knowledge appropriate. Mood and affect appropriate.  Cranial Nerves: Fundoscopic exam reveals sharp disc margins. Pupils equal, briskly reactive to light. Extraocular movements full without nystagmus. Visual fields full to confrontation. Hearing intact. Facial sensation intact. Face, tongue, palate moves normally and symmetrically.  Motor: Normal bulk and tone. Normal strength in all tested extremity muscles. Sensory.: intact to touch ,pinprick .position and vibratory sensation.  Coordination: Rapid alternating movements normal in all extremities. Finger-to-nose and heel-to-shin performed accurately bilaterally. Gait and Station: Arises from chair without difficulty. Stance is normal. Gait demonstrates normal stride length and balance . Able to heel, toe and tandem walk without difficulty.  Reflexes: 1+ and symmetric. Toes downgoing.   NIHSS  0Modified Rankin  0   ASSESSMENT: 62 year old Caucasian male with transient episode of gait ataxia due to posterior circulation TIA. Vascular risk factors of hypertension, hyperlipidimia , intracranial stenosis.  Patient is being seen today for follow-up appointment and overall has been stable from a stroke standpoint without recurring symptoms.    PLAN: -Continue aspirin 325 mg daily  and atorvastatin for secondary stroke prevention -F/u with PCP regarding your HLD and HTN management -continue to monitor BP at home -Advised to continue stay active and maintain a healthy diet -Maintain strict control of hypertension with blood pressure goal below 130/90, diabetes with hemoglobin A1c goal below 6.5% and cholesterol with LDL cholesterol (bad cholesterol) goal below 70 mg/dL. I also advised the patient to eat a healthy diet with plenty of  whole grains, cereals, fruits and vegetables, exercise regularly and maintain ideal body weight.  Follow up as needed as stable from stroke standpoint or call earlier if needed  Greater than 50% of time during this 25 minute visit was spent on counseling,explanation of diagnosis of TIA, reviewing risk factor management of HLD and HTN, planning of further management, discussion with patient and family and coordination of care  Venancio Poisson, AGNP-BC  [facility] [address] [place], [place] [address]  Phone [phone] Fax [phone] Note: This document was prepared with digital dictation and possible smart phrase technology. Any transcriptional errors that result from this process are unintentional.

## 2018-10-19 NOTE — Patient Instructions (Signed)
Continue aspirin 325 mg daily  and lipitor 20mg   for secondary stroke prevention  Continue to follow up with PCP regarding cholesterol and blood pressure management   Continue to stay active and maintain a healthy diet  Continue to monitor blood pressure at home  Maintain strict control of hypertension with blood pressure goal below 130/90, diabetes with hemoglobin A1c goal below 6.5% and cholesterol with LDL cholesterol (bad cholesterol) goal below 70 mg/dL. I also advised the patient to eat a healthy diet with plenty of whole grains, cereals, fruits and vegetables, exercise regularly and maintain ideal body weight.  Followup in the future with me as needed or call earlier if needed       Thank you for coming to see [place] at [facility]. I hope we have been able to provide you high quality care today.  You may receive a patient satisfaction survey over the next few weeks. We would appreciate your feedback and comments so that we may continue to improve ourselves and the health of our patients.

## 2018-10-20 DIAGNOSIS — C8400 Mycosis fungoides, unspecified site: Secondary | ICD-10-CM | POA: Diagnosis not present

## 2018-10-24 NOTE — Progress Notes (Signed)
I agree with the above plan 

## 2018-10-27 DIAGNOSIS — C8400 Mycosis fungoides, unspecified site: Secondary | ICD-10-CM | POA: Diagnosis not present

## 2018-11-03 DIAGNOSIS — C8400 Mycosis fungoides, unspecified site: Secondary | ICD-10-CM | POA: Diagnosis not present

## 2018-11-10 DIAGNOSIS — C8400 Mycosis fungoides, unspecified site: Secondary | ICD-10-CM | POA: Diagnosis not present

## 2018-11-17 DIAGNOSIS — C8400 Mycosis fungoides, unspecified site: Secondary | ICD-10-CM | POA: Diagnosis not present

## 2018-11-24 DIAGNOSIS — C8400 Mycosis fungoides, unspecified site: Secondary | ICD-10-CM | POA: Diagnosis not present

## 2018-12-01 DIAGNOSIS — C8400 Mycosis fungoides, unspecified site: Secondary | ICD-10-CM | POA: Diagnosis not present

## 2018-12-10 DIAGNOSIS — C8400 Mycosis fungoides, unspecified site: Secondary | ICD-10-CM | POA: Diagnosis not present

## 2018-12-20 DIAGNOSIS — C8400 Mycosis fungoides, unspecified site: Secondary | ICD-10-CM | POA: Diagnosis not present

## 2018-12-28 ENCOUNTER — Telehealth (INDEPENDENT_AMBULATORY_CARE_PROVIDER_SITE_OTHER): Payer: Self-pay | Admitting: Internal Medicine

## 2018-12-28 NOTE — Telephone Encounter (Signed)
Patient called would like to talk to Dr Laural Golden about a medication change - ph# [phone]

## 2018-12-30 NOTE — Telephone Encounter (Signed)
Patient was called and a message was left asking that he call our office with the medication he is wanting to change.

## 2019-01-03 ENCOUNTER — Telehealth (INDEPENDENT_AMBULATORY_CARE_PROVIDER_SITE_OTHER): Payer: Self-pay | Admitting: *Deleted

## 2019-01-03 NOTE — Telephone Encounter (Signed)
Patient states that the Dexilant 60 mg will cost him $300 per month. He cannot aford this. Patient was ask to look online and see what PPI's his Insurance will cover, call us with the names and Dr.Rehman will choose which one is best for him and his condition. Patient is in agreement.

## 2019-01-13 ENCOUNTER — Emergency Department (HOSPITAL_COMMUNITY)
Admission: EM | Admit: 2019-01-13 | Discharge: 2019-01-13 | Disposition: A | Discharge status: Home / Self Care | Payer: Managed Care, Other (non HMO) | Attending: Emergency Medicine | Admitting: Emergency Medicine

## 2019-01-13 ENCOUNTER — Other Ambulatory Visit: Payer: Self-pay

## 2019-01-13 ENCOUNTER — Encounter (HOSPITAL_COMMUNITY): Payer: Self-pay | Admitting: Emergency Medicine

## 2019-01-13 ENCOUNTER — Emergency Department (HOSPITAL_COMMUNITY): Payer: Managed Care, Other (non HMO)

## 2019-01-13 DIAGNOSIS — I10 Essential (primary) hypertension: Secondary | ICD-10-CM | POA: Diagnosis not present

## 2019-01-13 DIAGNOSIS — Z8673 Personal history of transient ischemic attack (TIA), and cerebral infarction without residual deficits: Secondary | ICD-10-CM | POA: Diagnosis not present

## 2019-01-13 DIAGNOSIS — R42 Dizziness and giddiness: Secondary | ICD-10-CM | POA: Insufficient documentation

## 2019-01-13 DIAGNOSIS — Z79899 Other long term (current) drug therapy: Secondary | ICD-10-CM | POA: Insufficient documentation

## 2019-01-13 DIAGNOSIS — Z7982 Long term (current) use of aspirin: Secondary | ICD-10-CM | POA: Diagnosis not present

## 2019-01-13 DIAGNOSIS — R2681 Unsteadiness on feet: Secondary | ICD-10-CM | POA: Diagnosis not present

## 2019-01-13 DIAGNOSIS — R2990 Unspecified symptoms and signs involving the nervous system: Secondary | ICD-10-CM | POA: Diagnosis not present

## 2019-01-13 LAB — DIFFERENTIAL
Abs Immature Granulocytes: 0.02 10*3/uL (ref 0.00–0.07)
Basophils Absolute: 0.1 10*3/uL (ref 0.0–0.1)
Basophils Relative: 1 %
Eosinophils Absolute: 0.2 10*3/uL (ref 0.0–0.5)
Eosinophils Relative: 4 %
Immature Granulocytes: 0 %
Lymphocytes Relative: 27 %
Lymphs Abs: 1.7 10*3/uL (ref 0.7–4.0)
Monocytes Absolute: 0.5 10*3/uL (ref 0.1–1.0)
Monocytes Relative: 9 %
NEUTROS PCT: 59 %
Neutro Abs: 3.6 10*3/uL (ref 1.7–7.7)

## 2019-01-13 LAB — URINALYSIS, ROUTINE W REFLEX MICROSCOPIC
Bilirubin Urine: NEGATIVE
Glucose, UA: NEGATIVE mg/dL
Hgb urine dipstick: NEGATIVE
Ketones, ur: NEGATIVE mg/dL
Leukocytes, UA: NEGATIVE
Nitrite: NEGATIVE
PH: 6 (ref 5.0–8.0)
Protein, ur: NEGATIVE mg/dL
SPECIFIC GRAVITY, URINE: 1.006 (ref 1.005–1.030)

## 2019-01-13 LAB — COMPREHENSIVE METABOLIC PANEL
ALT: 40 U/L (ref 0–44)
AST: 34 U/L (ref 15–41)
Albumin: 4.2 g/dL (ref 3.5–5.0)
Alkaline Phosphatase: 65 U/L (ref 38–126)
Anion gap: 10 (ref 5–15)
BUN: 13 mg/dL (ref 8–23)
CO2: 26 mmol/L (ref 22–32)
Calcium: 9.1 mg/dL (ref 8.9–10.3)
Chloride: 104 mmol/L (ref 98–111)
Creatinine, Ser: 0.86 mg/dL (ref 0.61–1.24)
GFR calc Af Amer: >60 mL/min (ref >60)
Glucose, Bld: 100 mg/dL — ABNORMAL HIGH (ref 70–99)
Potassium: 3.6 mmol/L (ref 3.5–5.1)
Sodium: 140 mmol/L (ref 135–145)
Total Bilirubin: 1.3 mg/dL — ABNORMAL HIGH (ref 0.3–1.2)
Total Protein: 6.9 g/dL (ref 6.5–8.1)

## 2019-01-13 LAB — CBC
HCT: 48.5 % (ref 39.0–52.0)
Hemoglobin: 15.9 g/dL (ref 13.0–17.0)
MCH: 30.1 pg (ref 26.0–34.0)
MCHC: 32.8 g/dL (ref 30.0–36.0)
MCV: 91.7 fL (ref 80.0–100.0)
PLATELETS: 232 10*3/uL (ref 150–400)
RBC: 5.29 MIL/uL (ref 4.22–5.81)
RDW: 13.1 % (ref 11.5–15.5)
WBC: 6.1 10*3/uL (ref 4.0–10.5)
nRBC: 0 % (ref 0.0–0.2)

## 2019-01-13 LAB — PROTIME-INR
INR: 1.04
PROTHROMBIN TIME: 13.5 s (ref 11.4–15.2)

## 2019-01-13 LAB — RAPID URINE DRUG SCREEN, HOSP PERFORMED
Amphetamines: NOT DETECTED
Barbiturates: NOT DETECTED
Benzodiazepines: NOT DETECTED
Cocaine: NOT DETECTED
Opiates: NOT DETECTED
Tetrahydrocannabinol: NOT DETECTED

## 2019-01-13 LAB — I-STAT TROPONIN, ED: TROPONIN I, POC: 0.01 ng/mL (ref 0.00–0.08)

## 2019-01-13 LAB — APTT: aPTT: 30 seconds (ref 24–36)

## 2019-01-13 LAB — I-STAT CREATININE, ED: Creatinine, Ser: 0.8 mg/dL (ref 0.61–1.24)

## 2019-01-13 LAB — ETHANOL: Alcohol, Ethyl (B): <10 mg/dL (ref <10)

## 2019-01-13 NOTE — ED Provider Notes (Signed)
[place] EMERGENCY DEPARTMENT Provider Note   CSN: [id number] Arrival date & time: 01/13/19  5188     History   Chief Complaint Chief Complaint  Patient presents with  . Code Stroke    HPI Scott Mathis is a 63 y.o. male.  HPI  Patient is a 63 year old male with a history of cutaneous lymphoma, TIA in February 2019, hypertension presenting for gait unsteadiness and vertigo.  Patient reports that his symptoms began suddenly around 6:30 AM.  He reports that he woke up, walked and felt fine, however when he got up to get coffee at 6:30 AM he felt like he was "on a boat".  He did report that he does sensation that the room is spinning.  It is beginning to slightly improved but he still has symptoms in the room.  He reports that as long as he sits still at rest he does not have symptoms.  Patient reports that this feels identical to his work-up in February 2019 when he was diagnosed with a TIA.  Patient denies any headache, neck pain, recent neck trauma or recent chiropractic manipulation.  He denies any visual disturbance, difficulty with speech or swallowing, weakness or numbness in extremities.  Patient takes 325 mg of aspirin daily which he took this morning in addition to atorvastatin.  Past Medical History:  Diagnosis Date  . Colon polyps   . GERD (gastroesophageal reflux disease)   . Hypertension   . Lymphoma ([place])   . Stroke [facility])     Patient Active Problem List   Diagnosis Date Noted  . Gastroesophageal reflux disease without esophagitis 07/21/2018  . Stroke ([place]) 01/30/2018  . Lymphoma ([place])   . Hypertension   . Family history of colon cancer 12/30/2017    Past Surgical History:  Procedure Laterality Date  . BIOPSY  09/09/2018   Procedure: BIOPSY;  Surgeon: Rogene Houston, MD;  Location: [facility];  Service: Endoscopy;;  gastric polyps  . Colonoscopy    . COLONOSCOPY    . COLONOSCOPY    . COLONOSCOPY N/A 05/12/2018   Procedure:  COLONOSCOPY;  Surgeon: Rogene Houston, MD;  Location: [facility];  Service: Endoscopy;  Laterality: N/A;  930  . ESOPHAGOGASTRODUODENOSCOPY N/A 09/09/2018   Procedure: ESOPHAGOGASTRODUODENOSCOPY (EGD);  Surgeon: Rogene Houston, MD;  Location: [facility];  Service: Endoscopy;  Laterality: N/A;  2:45  . Right shoulder surgery    . TONSILLECTOMY          Home Medications    Prior to Admission medications   Medication Sig Start Date End Date Taking? Authorizing Provider  amLODipine (NORVASC) 5 MG tablet Take 5 mg by mouth daily.  02/10/18   [provider]  aspirin 325 MG tablet Take 1 tablet (325 mg total) by mouth daily. 02/02/18   Mikhail, Velta Addison, DO  atorvastatin (LIPITOR) 20 MG tablet Take 1 tablet (20 mg total) by mouth daily at 6 PM. 02/01/18   Cristal Ford, DO  dexlansoprazole (DEXILANT) 60 MG capsule Take 1 capsule (60 mg total) by mouth daily. 09/14/18   Rogene Houston, MD  Multiple Vitamin (MULTI-VITAMINS) TABS Take 1 tablet by mouth daily.     [provider]  Omega-3 Fatty Acids (FISH OIL) 1000 MG CAPS Take 1,000 mg by mouth daily.     [provider]  spironolactone-hydrochlorothiazide (ALDACTAZIDE) 25-25 MG tablet Take by mouth. 02/27/17   [provider]  triamcinolone cream (KENALOG) 0.1 % Apply 1 application  topically See admin instructions. Apply to affected area once daily for 2 weeks at a time, taking 1 week off in between    [provider]    Family History Family History  Problem Relation Age of Onset  . Colon cancer Maternal Aunt   . Colon cancer Maternal Grandfather     Social History Social History   Tobacco Use  . Smoking status: Never Smoker  . Smokeless tobacco: Never Used  Substance Use Topics  . Alcohol use: No    Frequency: Never  . Drug use: No     Allergies   Patient has no known allergies.   Review of Systems Review of Systems  Constitutional: Negative for chills and fever.  HENT:  Negative for congestion and sore throat.   Eyes: Negative for visual disturbance.  Respiratory: Negative for cough, chest tightness and shortness of breath.   Cardiovascular: Negative for chest pain, palpitations and leg swelling.  Gastrointestinal: Negative for abdominal pain, nausea and vomiting.  Genitourinary: Negative for dysuria and flank pain.  Musculoskeletal: Negative for back pain and myalgias.  Skin: Negative for rash.  Neurological: Positive for dizziness and light-headedness. Negative for syncope, weakness, numbness and headaches.     Physical Exam Updated Vital Signs BP (!) 164/87   Pulse 70   Temp 97.9 F (36.6 C) (Oral)   Resp 18   Ht 5\' 10"  (1.778 m)   Wt 98.6 kg   SpO2 95%   BMI 31.19 kg/m   Physical Exam Vitals signs and nursing note reviewed.  Constitutional:      General: He is not in acute distress.    Appearance: He is well-developed.  HENT:     Head: Normocephalic and atraumatic.  Eyes:     Conjunctiva/sclera: Conjunctivae normal.     Pupils: Pupils are equal, round, and reactive to light.  Neck:     Musculoskeletal: Normal range of motion and neck supple.  Cardiovascular:     Rate and Rhythm: Normal rate and regular rhythm.     Heart sounds: S1 normal and S2 normal. No murmur.  Pulmonary:     Effort: Pulmonary effort is normal.     Breath sounds: Normal breath sounds. No wheezing or rales.  Abdominal:     General: There is no distension.     Palpations: Abdomen is soft.     Tenderness: There is no abdominal tenderness. There is no guarding.  Musculoskeletal: Normal range of motion.        General: No deformity.  Lymphadenopathy:     Cervical: No cervical adenopathy.  Skin:    General: Skin is warm and dry.     Findings: No erythema or rash.  Neurological:     Mental Status: He is alert.     Comments: Mental Status:  Alert, oriented, thought content appropriate, able to give a coherent history. Speech fluent without evidence of aphasia.  Able to follow 2 step commands without difficulty.  Cranial Nerves:  II:  Peripheral visual fields grossly normal, pupils equal, round, reactive to light III,IV, VI: ptosis not present, extra-ocular motions intact bilaterally  V,VII: smile symmetric, facial light touch sensation equal VIII: hearing grossly normal to voice  X: uvula elevates symmetrically  XI: bilateral shoulder shrug symmetric and strong XII: midline tongue extension without fassiculations Motor:  Normal tone. 5/5 in upper and lower extremities bilaterally including strong and equal grip strength and dorsiflexion/plantar flexion Sensory: Pinprick and light touch normal in all extremities.  Cerebellar: normal finger-to-nose  with bilateral upper extremities Stance: No pronator drift and good coordination, strength, and position sense with tapping of bilateral arms (performed in sitting position). CV: distal pulses palpable throughout   HINTS Exam:  Head Impulse: Patient is able to focus on one visual focus during entire head impulse Test of skew negative. Patient has several beats of left-sided nystagmus bilaterally.  Psychiatric:        Behavior: Behavior normal.        Thought Content: Thought content normal.        Judgment: Judgment normal.      Large Artery Stroke Screening     Weakness:  Patient shows no weakness.    Vision:  NONE  Aphasia:  NONE  Neglect:  NONE:  VAN =  Negative  If patient has any weakness PLUS any one of the below: Visual Disturbance (field cut, double, or blind vision) Aphasia (inability to speak or understand) Neglect (gaze to one side or ignoring one side) This is likely a large artery clot (cortical symptoms) = VAN Positive  ED Treatments / Results  Labs (all labs ordered are listed, but only abnormal results are displayed) Labs Reviewed  COMPREHENSIVE METABOLIC PANEL - Abnormal; Notable for the following components:      Result Value   Glucose, Bld 100 (*)    Total  Bilirubin 1.3 (*)    All other components within normal limits  ETHANOL  PROTIME-INR  APTT  CBC  DIFFERENTIAL  RAPID URINE DRUG SCREEN, HOSP PERFORMED  URINALYSIS, ROUTINE W REFLEX MICROSCOPIC  I-STAT TROPONIN, ED  I-STAT CREATININE, ED    EKG EKG Interpretation  Date/Time:  Thursday January 13 2019 09:16:01 EST Ventricular Rate:  77 PR Interval:    QRS Duration: 106 QT Interval:  404 QTC Calculation: 458 R Axis:   64 Text Interpretation:  Sinus rhythm RSR' in V1 or V2, probably normal variant Left ventricular hypertrophy Confirmed by Lennice Sites [phone]) on 01/13/2019 9:59:55 AM   Radiology Mr Brain Wo Contrast  Result Date: 01/13/2019 CLINICAL DATA:  Episodic vertigo, peripheral EXAM: MRI HEAD WITHOUT CONTRAST TECHNIQUE: Multiplanar, multiecho pulse sequences of the brain and surrounding structures were obtained without intravenous contrast. COMPARISON:  CT head 01/13/2019, MRI head 02/10/2018 FINDINGS: Brain: Ventricle size and cerebral volume normal. Several small hyperintensities in the frontal white matter bilaterally unchanged. No acute infarct. Negative for hemorrhage or mass or midline shift. Vascular: Normal arterial flow voids Skull and upper cervical spine: Negative Sinuses/Orbits: Mild mucosal edema paranasal sinuses.  Normal orbit Other: None IMPRESSION: No acute abnormality. Mild chronic white matter changes stable from the prior study. Electronically Signed   By: Franchot Gallo M.D.   On: 01/13/2019 12:31   Ct Head Code Stroke Wo Contrast  Result Date: 01/13/2019 CLINICAL DATA:  Code stroke.  Vertigo and unsteady gait EXAM: CT HEAD WITHOUT CONTRAST TECHNIQUE: Contiguous axial images were obtained from the base of the skull through the vertex without intravenous contrast. COMPARISON:  01/31/2018 FINDINGS: Brain: No evidence of acute infarction, hemorrhage, hydrocephalus, extra-axial collection or mass lesion/mass effect. Vascular: Atherosclerotic calcification. Mild  prominent density of the mid basilar, but not definite for hyperdense vessel based on reformats. There is already plan for vessel imaging. Skull: Negative Sinuses/Orbits: Negative Other: These results were called by telephone at the time of interpretation on 01/13/2019 at 9:55 am to Dr. Cheral Marker, who verbally acknowledged these results. Not scored with this history IMPRESSION: No hemorrhage or visible infarct. Electronically Signed   By: Neva Seat.D.  On: 01/13/2019 09:57    Procedures Procedures (including critical care time)  Medications Ordered in ED Medications - No data to display   Initial Impression / Assessment and Plan / ED Course  I have reviewed the triage vital signs and the nursing notes.  Pertinent labs & imaging results that were available during my care of the patient were reviewed by me and considered in my medical decision making (see chart for details).  Clinical Course as of Jan 13 949  Sheryle Spray  4742 Code stroke called. Airway cleared by Dr. Lennice Sites. Patient evaluated by Dr. Cheral Marker at bedside.    [AM]    Clinical Course User Index [AM] Albesa Seen, PA-C   Patient nontoxic-appearing hemodynamically stable, and in no acute distress.  Patient neurologically intact with exception of left beating horizontal nystagmus and continued symptoms of feeling off balance.  Code stroke called due to symptoms similar to prior episode that was deemed a TIA.  Neurology evaluated patient at bedside.  10:38 AM Discussed with Neuro NP Metzger-Cihelka who states that patient does not need admitted if MRI negative. Symptoms suggestive of peripheral vertigo. Recommends vestibular therapy. Appreciate her involvement. Patient with improvement after Marye Round and Epley maneuvers per neuro NP.   Patient's work-up was unremarkable.  EKG normal sinus rhythm without evidence of acute ischemia, fracture, or arrhythmia.  CT head code stroke was negative.  MRI brain  without contrast demonstrates stable and unchanged white matter changes suggestive of hypertension but no acute stroke.  Per discussion with neurology patient discharge.  Patient was given amatory referral to physical therapy as well as ENT for vestibular rehab.  Patient was instructed if he has any further episodes of vertigo, diplopia, or feeling off balance that he is to return to the emergency department given his risk factors for CVA.  Patient and his wife are in understanding and agree with the plan of care.  This is a shared visit with Dr. Lennice Sites. Patient was independently evaluated by this attending physician. Attending physician consulted in evaluation and discharge management.  Final Clinical Impressions(s) / ED Diagnoses   Final diagnoses:  Vertigo  Unsteady gait    ED Discharge Orders    None       Tamala Julian 01/13/19 [place], [place], DO 01/14/19 [phone]

## 2019-01-13 NOTE — ED Notes (Signed)
Patient transported to MRI 

## 2019-01-13 NOTE — Code Documentation (Signed)
63 yo male coming from home where he lives with his wife complaining of sudden onset of dizziness and unsteady gait that started at 0630. Pt woke up and started working at 0500. He was at baseline until he sat down at 0630 and attempted to get up with sudden onset of dizziness. Pt was brought in [place] by his wife at 65. Brought back to [facility] and seen by EDP who activated a Code Stroke. Stroke Team met patient in room and evaluated patient. Initial NIHSS 0. Pt has no focal deficits noted. CT Head negative for hemorrhage. Pt was walked in the CT scanner and was able to walk without assistance. Some balance issues with heel-to-toe walking, but otherwise steady on his feet. Reports minimal dizziness. Pt not a tPA candidate at this time due to too mild to treat. Pt to have MRI and confirm stroke. Placed on cardiac monitor and stable at this time. Handoff given to [place], [place].

## 2019-01-13 NOTE — Consult Note (Addendum)
Referring Physician: ER    Reason for Consult: Code Stroke  HPI: Scott Mathis is an 63 y.o. male who arrived to the ER via POV after an acute onset of dizziness when he attempted to stand up from sitting position. He has PMH of GERD, HTN and lymphoma. He states the diagnosis of "TIA" a few years ago was also a bout of dizziness upon position change. He states that the dizziness feels as though he is unsteady and floating on a boat, without a vertigo component. He felt the need to grab walls while walking at home. Denies appendicular ataxia or tremor. Denies limb weakness or numbness. No dysarthria or vision loss. No CP, SOB, cough or fever.   Today he began woir at 0500 and when he went to stand at [place] he had a sudden onset of dizziness and couldn't walk straight. He wife later brought him to the ER NIHSS 0. There are no central findings on exam. His dizziness has much improved at this time. He can walk a straight line, but is unable to walk heal-to toe without loosing balance and he has a slight right gaze 3 beat nystagmus, but no blurred or double vision, no N/V and no h/a. Dix Hallpike preformed and was neg at this time, therefore no Epley maneuver done. Stat CTH was neg.  Date last known well: today Time last known well: 0629; self reported acute onset at 0630  tPA Given: too mild of findings to treat  Past Medical History Past Medical History:  Diagnosis Date  . Colon polyps   . GERD (gastroesophageal reflux disease)   . Hypertension   . Lymphoma [facility])   TIA      Surgical History Past Surgical History:  Procedure Laterality Date  . BIOPSY  09/09/2018   Procedure: BIOPSY;  Surgeon: Rogene Houston, MD;  [facility];  Service: Endoscopy;;  gastric polyps  . Colonoscopy    . COLONOSCOPY    . COLONOSCOPY    . COLONOSCOPY N/A 05/12/2018   Procedure: COLONOSCOPY;  Surgeon: Rogene Houston, MD;  [facility];  Service: Endoscopy;  Laterality: N/A;  930   . ESOPHAGOGASTRODUODENOSCOPY N/A 09/09/2018   Procedure: ESOPHAGOGASTRODUODENOSCOPY (EGD);  Surgeon: Rogene Houston, MD;  Location[facility];  Service: Endoscopy;  Laterality: N/A;  2:45  . Right shoulder surgery    . TONSILLECTOMY      Family History  Family History  Problem Relation Age of Onset  . Colon cancer Maternal Aunt   . Colon cancer Maternal Grandfather     Social History:   reports that he has never smoked. He has never used smokeless tobacco. He reports that he does not drink alcohol or use drugs.  Allergies:  No Known Allergies  Home Medications:  (Not in a hospital admission)   Hospital Medications   ROS: nNegative for rash and skin lesion changes since he has been on treatment for his cutaneous lymphoma Other ROS as per HPI.    Physical Examination:  Vitals:   01/13/19 0916 01/13/19 1015 01/13/19 1030 01/13/19 1045  BP:  134/83 132/86 (!) 144/91  Pulse:  67 65 71  Resp:  17 18 14   Temp: 97.9 F (36.6 C)     TempSrc: Oral     SpO2:  96% 97% 98%  Weight: 98.6 kg     Height:        General - mild distress, well groomed and appears younger than stated age  Heart - Regular rate and rhythm - no murmer appreciated Lungs - Clear to auscultation  Abdomen - Soft - non tender Extremities - Distal pulses intact - no edema Skin - Warm and dry   Neurologic Examination:  Mental Status: Alert, oriented, thought content appropriate.  Speech fluent without evidence of aphasia. Able to follow all commands without difficulty. Cranial Nerves: II: Visual fields grossly normal, pupils equal, round, reactive to light. Upon right gaze there are 2-3 beats of nystagmus, which then resolves III,IV, VI: ptosis not present, EOMI without nystagmus. Eyes conjugate. No double vision.  V,VII: smile symmetric, facial light touch sensation normal bilaterally VIII: hearing intact to voice IX,X: Phonation intact XI: bilateral shoulder shrug XII: midline tongue  extension Motor: RUE - 5/5    LUE - 5/5   RLE - 5/5    LLE - 5/5 Normal tone throughout; no atrophy noted Sensory: Light touch intact throughout, bilaterally Deep Tendon Reflexes: 2+ and symmetric throughout Plantars: Right: downgoing   Left: downgoing Cerebellar: normal finger-to-nose normal heel-to-shin test bilaterally. RAM normal bilaterally. No tremor.  Gait: Tentative gait with somewhat small steps. No truncal or limb ataxia. Has difficulty with tandem gait. Negative Romberg. While marching in place with eyes closed, patient does not drift in rotation or laterally [employer]: increased in dizziness, but no sustained nystagmus seen. There were again 3 beats of horizontal nystagmus, fast beating to the right, with right gaze. HINTS exam: Negative  NIHSS 1a Level of Conscious:0 1b LOC Questions: 0 1c LOC Commands: 0 2 Best Gaze: 0 3 Visual: 0 4 Facial Palsy: 0 5a Motor Arm - left: 0 5b Motor Arm - Right: 0 6a Motor Leg - Left: 0 6b Motor Leg - Right: 0 7 Limb Ataxia: 0 8 Sensory: 0 9 Best Language: 0 10 Dysarthria:0 11 Extinct. and Inattention:0 TOTAL: 0   LABORATORY STUDIES:  Basic Metabolic Panel: Recent Labs  Lab 01/13/19 0938 01/13/19 0942  NA 140  --   K 3.6  --   CL 104  --   CO2 26  --   GLUCOSE 100*  --   BUN 13  --   CREATININE 0.86 0.80  CALCIUM 9.1  --     Liver Function Tests: Recent Labs  Lab 01/13/19 0938  AST 34  ALT 40  ALKPHOS 65  BILITOT 1.3*  PROT 6.9  ALBUMIN 4.2   No results for input(s): LIPASE, AMYLASE in the last 168 hours. No results for input(s): AMMONIA in the last 168 hours.  CBC: Recent Labs  Lab 01/13/19 0938  WBC 6.1  NEUTROABS 3.6  HGB 15.9  HCT 48.5  MCV 91.7  PLT 232    Cardiac Enzymes: No results for input(s): CKTOTAL, CKMB, CKMBINDEX, TROPONINI in the last 168 hours.  BNP: Invalid input(s): POCBNP  CBG: No results for input(s): GLUCAP in the last 168 hours.  Microbiology:   Coagulation  Studies: Recent Labs    01/13/19 0938  LABPROT 13.5  INR 1.04    Urinalysis:  Recent Labs  Lab 01/13/19 0936  COLORURINE YELLOW  LABSPEC 1.006  PHURINE 6.0  GLUCOSEU NEGATIVE  HGBUR NEGATIVE  BILIRUBINUR NEGATIVE  KETONESUR NEGATIVE  PROTEINUR NEGATIVE  NITRITE NEGATIVE  LEUKOCYTESUR NEGATIVE    Lipid Panel:     Component Value Date/Time   CHOL 149 02/01/2018 0220   TRIG 81 02/01/2018 0220   HDL 43 02/01/2018 0220   CHOLHDL 3.5 02/01/2018 0220   VLDL 16 02/01/2018 0220   LDLCALC 90  02/01/2018 0220    HgbA1C:  Lab Results  Component Value Date   HGBA1C 5.2 02/01/2018    Urine Drug Screen:      Component Value Date/Time   LABOPIA NONE DETECTED 01/13/2019 0936   COCAINSCRNUR NONE DETECTED 01/13/2019 0936   LABBENZ NONE DETECTED 01/13/2019 0936   AMPHETMU NONE DETECTED 01/13/2019 0936   THCU NONE DETECTED 01/13/2019 0936   LABBARB NONE DETECTED 01/13/2019 0936     Alcohol Level:  Recent Labs  Lab 01/13/19 0938  ETH <10    Miscellaneous labs:  EKG  EKG   IMAGING: Ct Head Code Stroke Wo Contrast  Result Date: 01/13/2019 CLINICAL DATA:  Code stroke.  Vertigo and unsteady gait EXAM: CT HEAD WITHOUT CONTRAST TECHNIQUE: Contiguous axial images were obtained from the base of the skull through the vertex without intravenous contrast. COMPARISON:  01/31/2018 FINDINGS: Brain: No evidence of acute infarction, hemorrhage, hydrocephalus, extra-axial collection or mass lesion/mass effect. Vascular: Atherosclerotic calcification. Mild prominent density of the mid basilar, but not definite for hyperdense vessel based on reformats. There is already plan for vessel imaging. Skull: Negative Sinuses/Orbits: Negative Other: These results were called by telephone at the time of interpretation on 01/13/2019 at 9:55 am to Dr. Cheral Marker, who verbally acknowledged these results. Not scored with this history IMPRESSION: No hemorrhage or visible infarct. Electronically Signed   By:  Monte Fantasia M.D.   On: 01/13/2019 09:57     Assessment: 63 y.o. male with sudden onset of dizziness and unsteady gait. This is now much improved. Pt has history of this previously for which extensive work up was done. He improved and also tells me he did vestibular rehab/outpt PT/OT.  1. Stroke Risk Factors - HTN only; report of previous TIA. DDx may have also included BPPV or a vestibular neuronitis 2. Dizziness-  Marye Round performed and is negative at this time, therefore no Epley maneuver done.  3. Stat CTH was negative.  4. MRI brain: No acute abnormality. Mild chronic white matter changes stable from the prior study. 5. Given lack of acute abnormality on MRI. The patient's presentation is most likely secondary to a peripheral vestibulopathy. Suspect dx of BPPV or vestibular neuronitis. Meniere's disease is also on the DDx. Educated pt/wife on this.  6. HTN- well controlled, resume home meds   Plan:  MRI is negative. Cleared to d/c home with out pt Vestibular PT/OT and outpatient Neurology or ENT f/u for peripheral vestibulopathy  Please call Neurohospitalist team with any additional questions.     Desiree Metzger-Cihelka, ARNP-C, ANVP-BC   I have interviewed and examined the patient. My exam findings were documented by the Neurology NP. I have formulated the assessment and plan.  Electronically signed: Dr. Kerney Elbe

## 2019-01-13 NOTE — ED Notes (Signed)
Pt ambulatory to bathroom with no difficult

## 2019-01-13 NOTE — ED Triage Notes (Addendum)
Pt arrives to ED from home with complaints of dizziness and being off balance starting this morning around 0630. Pt reports hx of TIA's. No weakness noted. MD and PA notified. Pt placed in position of comfort with bed locked and lowered, call bell in reach.

## 2019-01-13 NOTE — Discharge Instructions (Signed)
Please see the information and instructions below regarding your visit.  Your diagnoses today include:  1. Vertigo   2. Unsteady gait     Tests performed today include: See side panel of your discharge paperwork for testing performed today. Vital signs are listed at the bottom of these instructions.   Medications prescribed:    Take any prescribed medications only as prescribed, and any over the counter medications only as directed on the packaging.  Home care instructions:  Please follow any educational materials contained in this packet.   Follow-up instructions: Please follow-up with your primary care provider for further evaluation.  Please follow up with Dr. Erik Obey of ENT as soon as possible.   Return instructions:  Please return to the Emergency Department if you experience worsening symptoms.  Please return the emergency department if you develop any persistent episodes of any non-resolving vertigo or feeling off balance, weakness or numbness, difficulty speaking, difficulty swallowing, confusion or altered mental status. Please return if you have any other emergent concerns.  Additional Information:   Your vital signs today were: BP (!) 146/86    Pulse 62    Temp 97.9 F (36.6 C) (Oral)    Resp 16    Ht 5\' 10"  (1.778 m)    Wt 98.6 kg    SpO2 95%    BMI 31.19 kg/m  If your blood pressure (BP) was elevated on multiple readings during this visit above 130 for the top number or above 80 for the bottom number, please have this repeated by your primary care provider within one month. --------------  Thank you for allowing [place] to participate in your care today.

## 2019-01-13 NOTE — ED Notes (Signed)
Pt here for dizziness and feeling off balance, this RN completed a full NIH scale in lobby and it was 0.

## 2019-01-13 NOTE — ED Provider Notes (Signed)
Medical screening examination/treatment/procedure(s) were conducted as a shared visit with non-physician practitioner(s) and myself.  I personally evaluated the patient during the encounter. Briefly, the patient is a 63 y.o. male with history of hypertension, stroke who presents to the ED with dizziness.  Patient with normal vitals.  No fever.  Patient overall with normal neurological exam.  Dizziness is positional.  Symptoms started less than 4 hours ago.  Patient has history of cerebellar stroke that presented the same way.  Does have some nystagmus.  Stroke was activated due to dizziness and history and patient within window for TPA.  CT scan was overall unremarkable.  No acute stroke.  Patient is not a TPA candidate.  Suspect likely peripheral vertigo.  EKG showed sinus rhythm.  No ischemic changes.  Patient with no significant electrolyte abnormality, kidney injury, leukocytosis.  MRI was ordered that showed no acute stroke.  Neurology recommends discharge to home.  Likely peripheral vertigo.  Given return precautions.  Discharged in good condition.  This chart was dictated using voice recognition software.  Despite best efforts to proofread,  errors can occur which can change the documentation meaning.    EKG Interpretation  Date/Time:  Thursday January 13 2019 09:16:01 EST Ventricular Rate:  77 PR Interval:    QRS Duration: 106 QT Interval:  404 QTC Calculation: 458 R Axis:   64 Text Interpretation:  Sinus rhythm RSR' in V1 or V2, probably normal variant Left ventricular hypertrophy Confirmed by Lennice Sites [phone]) on 01/13/2019 9:59:55 AM          Lennice Sites, DO 01/13/19 1346

## 2019-01-13 NOTE — ED Notes (Signed)
Neurologist at bedside. MD Cheral Marker

## 2019-03-16 ENCOUNTER — Ambulatory Visit: Payer: 59 | Admitting: Neurology

## 2019-04-20 ENCOUNTER — Ambulatory Visit (INDEPENDENT_AMBULATORY_CARE_PROVIDER_SITE_OTHER): Payer: Managed Care, Other (non HMO) | Admitting: Nurse Practitioner

## 2019-06-29 ENCOUNTER — Ambulatory Visit (INDEPENDENT_AMBULATORY_CARE_PROVIDER_SITE_OTHER): Payer: Managed Care, Other (non HMO) | Admitting: Urology

## 2019-06-29 DIAGNOSIS — R972 Elevated prostate specific antigen [PSA]: Secondary | ICD-10-CM | POA: Diagnosis not present

## 2019-07-28 ENCOUNTER — Other Ambulatory Visit: Payer: Self-pay

## 2019-07-28 DIAGNOSIS — Z20822 Contact with and (suspected) exposure to covid-19: Secondary | ICD-10-CM

## 2019-07-29 LAB — NOVEL CORONAVIRUS, NAA: SARS-CoV-2, NAA: NOT DETECTED

## 2019-08-25 ENCOUNTER — Other Ambulatory Visit (INDEPENDENT_AMBULATORY_CARE_PROVIDER_SITE_OTHER): Payer: Self-pay | Admitting: Internal Medicine

## 2019-08-25 DIAGNOSIS — I10 Essential (primary) hypertension: Secondary | ICD-10-CM

## 2019-10-20 ENCOUNTER — Encounter (INDEPENDENT_AMBULATORY_CARE_PROVIDER_SITE_OTHER): Payer: Self-pay | Admitting: Internal Medicine

## 2019-10-20 ENCOUNTER — Ambulatory Visit (INDEPENDENT_AMBULATORY_CARE_PROVIDER_SITE_OTHER): Payer: Managed Care, Other (non HMO) | Admitting: Internal Medicine

## 2019-10-20 ENCOUNTER — Other Ambulatory Visit: Payer: Self-pay

## 2019-10-20 VITALS — BP 136/80 | HR 72 | Ht 70.0 in | Wt 197.8 lb

## 2019-10-20 DIAGNOSIS — R972 Elevated prostate specific antigen [PSA]: Secondary | ICD-10-CM

## 2019-10-20 DIAGNOSIS — E782 Mixed hyperlipidemia: Secondary | ICD-10-CM

## 2019-10-20 DIAGNOSIS — I10 Essential (primary) hypertension: Secondary | ICD-10-CM | POA: Diagnosis not present

## 2019-10-20 DIAGNOSIS — C8400 Mycosis fungoides, unspecified site: Secondary | ICD-10-CM

## 2019-10-20 DIAGNOSIS — Z0001 Encounter for general adult medical examination with abnormal findings: Secondary | ICD-10-CM

## 2019-10-20 DIAGNOSIS — E559 Vitamin D deficiency, unspecified: Secondary | ICD-10-CM

## 2019-10-20 DIAGNOSIS — E785 Hyperlipidemia, unspecified: Secondary | ICD-10-CM | POA: Insufficient documentation

## 2019-10-20 DIAGNOSIS — E041 Nontoxic single thyroid nodule: Secondary | ICD-10-CM

## 2019-10-20 HISTORY — DX: Hyperlipidemia, unspecified: E78.5

## 2019-10-20 NOTE — Progress Notes (Signed)
Chief Complaint: This 63 year old man comes in for an annual physical exam and to address his chronic conditions which are described below. HPI: He has a history of hypertension and as he has been losing weight, his dose of amlodipine has been able to be decreased.  He denies any chest pain, dyspnea, palpitations or limb weakness.  He has no history of coronary artery disease  although he did have a TIA in the past. He has been diagnosed with mycosis fungoides and is being treated at [facility] for this.  He was in remission but he is getting some areas in his feet and lower legs at the present time which is going to be followed at [facility]. He also has elevated PSA and this is being followed by Dr. Alyson Ingles, urologist and he was seen by him a few months ago and will be followed again in the new year. He also had several thyroid nodules seen on ultrasound in March 2019.  He is due for follow-up with an ultrasound for this. Past Medical History:  Diagnosis Date   Colon polyps    GERD (gastroesophageal reflux disease)    HLD (hyperlipidemia) 10/20/2019   Hypertension    Lymphoma ([place])    Stroke [facility])    Past Surgical History:  Procedure Laterality Date   BIOPSY  09/09/2018   Procedure: BIOPSY;  Surgeon: Rogene Houston, MD;  Location: [facility];  Service: Endoscopy;;  gastric polyps   Colonoscopy     COLONOSCOPY     COLONOSCOPY     COLONOSCOPY N/A 05/12/2018   Procedure: COLONOSCOPY;  Surgeon: Rogene Houston, MD;  Location: [facility];  Service: Endoscopy;  Laterality: N/A;  930   ESOPHAGOGASTRODUODENOSCOPY N/A 09/09/2018   Procedure: ESOPHAGOGASTRODUODENOSCOPY (EGD);  Surgeon: Rogene Houston, MD;  Location: [facility];  Service: Endoscopy;  Laterality: N/A;  2:45   Right shoulder surgery     TONSILLECTOMY       Social History   Social History Narrative   Married 26 years,second.First marriage lasted 72 years.Retired,but works part-time ,soon to retire completely in  March 2021    Social History   Tobacco Use   Smoking status: Never Smoker   Smokeless tobacco: Never Used  Substance Use Topics   Alcohol use: No    Frequency: Never      Allergies: No Known Allergies   Current Meds  Medication Sig   amLODipine (NORVASC) 5 MG tablet Take 5 mg by mouth daily.   aspirin 325 MG tablet Take 1 tablet (325 mg total) by mouth daily.   Multiple Vitamin (MULTI-VITAMINS) TABS Take 1 tablet by mouth daily.    Omega-3 Fatty Acids (FISH OIL) 1000 MG CAPS Take 1,000 mg by mouth daily.    triamcinolone cream (KENALOG) 0.1 % Apply 1 application topically See admin instructions. Apply to affected area once daily for 2 weeks at a time, taking 1 week off in between as needed.      [id number] from the symptoms mentioned above,there are no other symptoms referable to all systems reviewed.  Physical Exam: Blood pressure 136/80, pulse 72, height 5\' 10"  (1.778 m), weight 197 lb 12.8 oz (89.7 kg). Vitals with BMI 10/20/2019 01/13/2019 01/13/2019  Height 5\' 10"  - -  Weight 197 lbs 13 oz - -  BMI 123456 - -  Systolic [id number]  Diastolic 80 86 90  Pulse 72 60 63      He look systemically well. General: Alert, cooperative, and appears  to be the stated age.No pallor.  No jaundice.  No clubbing. Head: Normocephalic Eyes: Sclera white, pupils equal and reactive to light, red reflex x 2,  Ears: Normal bilaterally Oral cavity: Lips, mucosa, and tongue normal: Teeth and gums normal Neck: No adenopathy, supple, symmetrical, trachea midline, and thyroid does not appear enlarged Respiratory: Clear to auscultation bilaterally.No wheezing, crackles or bronchial breathing. Cardiovascular: Heart sounds are present and appear to be normal without murmurs or added sounds.  No carotid bruits.  Peripheral pulses are present and equal bilaterally.: Gastrointestinal:positive bowel sounds, no hepatosplenomegaly.  No masses felt.No tenderness. Skin: Clear, No rashes  noted.No worrisome skin lesions seen. Neurological: Grossly intact without focal findings, cranial nerves II through XII intact, muscle strength equal bilaterally Musculoskeletal: No acute joint abnormalities noted.Full range of movement noted with joints. Psychiatric: Affect appropriate, non-anxious.    Assessment  1. Essential hypertension   2. Mixed hyperlipidemia   3. Mycosis fungoides, unspecified body region ([place])   4. Encounter for general adult medical examination with abnormal findings   5. Vitamin D deficiency disease   6. Thyroid nodule   7. Elevated PSA     Tests Ordered:   Orders Placed This Encounter  Procedures   US THYROID   CBC   COMPLETE METABOLIC PANEL WITH GFR   Lipid panel   T3, free   T4   TSH   VITAMIN D 25 Hydroxy (Vit-D Deficiency, Fractures)     Plan  1. We discussed his hypertension and since he has lost significant amount of weight, I wonder whether we can decrease or even eliminate his amlodipine now.  He will keep a check on his blood pressure at home and I have told him now to take amlodipine 5 mg every other day and check his blood pressures on the days that he is not taking the medicine as well as the days he does take it and get readings for a couple of weeks and then message me on my chart so that we can decide where to go next in terms of dosing.  We discussed the possibility of reducing the dose of amlodipine to 2.5 mg daily and hopefully if eventually discontinuing the medicine. 2. I will arrange ultrasound of the thyroid for follow-up of thyroid nodules. 3. Blood work is ordered as above. 4. Further recommendations will depend on blood results and I will follow up with him in about 6 months time. 5. Today, in addition to a preventative visit, I performed an office visit to address the conditions above.     No orders of the defined types were placed in this encounter.    Maham Quintin C Johanan Skorupski   10/20/2019, 4:23 PM

## 2019-10-21 ENCOUNTER — Telehealth (INDEPENDENT_AMBULATORY_CARE_PROVIDER_SITE_OTHER): Payer: Self-pay

## 2019-10-21 LAB — CBC
HCT: 44 % (ref 38.5–50.0)
Hemoglobin: 14.9 g/dL (ref 13.2–17.1)
MCH: 30.3 pg (ref 27.0–33.0)
MCHC: 33.9 g/dL (ref 32.0–36.0)
MCV: 89.6 fL (ref 80.0–100.0)
MPV: 10 fL (ref 7.5–12.5)
Platelets: 236 10*3/uL (ref 140–400)
RBC: 4.91 10*6/uL (ref 4.20–5.80)
RDW: 13.3 % (ref 11.0–15.0)
WBC: 9.1 10*3/uL (ref 3.8–10.8)

## 2019-10-21 LAB — COMPLETE METABOLIC PANEL WITH GFR
AG Ratio: 2 (calc) (ref 1.0–2.5)
ALT: 15 U/L (ref 9–46)
AST: 20 U/L (ref 10–35)
Albumin: 4.3 g/dL (ref 3.6–5.1)
Alkaline phosphatase (APISO): 65 U/L (ref 35–144)
BUN: 12 mg/dL (ref 7–25)
CO2: 31 mmol/L (ref 20–32)
Calcium: 9.3 mg/dL (ref 8.6–10.3)
Chloride: 102 mmol/L (ref 98–110)
Creat: 0.79 mg/dL (ref 0.70–1.25)
GFR, Est African American: 111 mL/min/{1.73_m2} (ref >=60)
GFR, Est Non African American: 96 mL/min/{1.73_m2} (ref >=60)
Globulin: 2.2 g/dL (calc) (ref 1.9–3.7)
Glucose, Bld: 88 mg/dL (ref 65–99)
Potassium: 3.8 mmol/L (ref 3.5–5.3)
Sodium: 139 mmol/L (ref 135–146)
Total Bilirubin: 0.6 mg/dL (ref 0.2–1.2)
Total Protein: 6.5 g/dL (ref 6.1–8.1)

## 2019-10-21 LAB — LIPID PANEL
Cholesterol: 180 mg/dL (ref <200)
HDL: 48 mg/dL (ref >=40)
LDL Cholesterol (Calc): 106 mg/dL (calc) — ABNORMAL HIGH
Non-HDL Cholesterol (Calc): 132 mg/dL (calc) — ABNORMAL HIGH (ref <130)
Total CHOL/HDL Ratio: 3.8 (calc) (ref <5.0)
Triglycerides: 148 mg/dL (ref <150)

## 2019-10-21 LAB — TSH: TSH: 0.99 mIU/L (ref 0.40–4.50)

## 2019-10-21 LAB — VITAMIN D 25 HYDROXY (VIT D DEFICIENCY, FRACTURES): Vit D, 25-Hydroxy: 52 ng/mL (ref 30–100)

## 2019-10-21 LAB — T4: T4, Total: 7.6 ug/dL (ref 4.9–10.5)

## 2019-10-21 LAB — T3, FREE: T3, Free: 3 pg/mL (ref 2.3–4.2)

## 2019-10-21 NOTE — Telephone Encounter (Signed)
US  Thyroid schedule at [facility] @ 10/25/2019 @ 12:30 afternoon. Arrive at 12:15 to check-in

## 2019-10-25 ENCOUNTER — Ambulatory Visit (HOSPITAL_COMMUNITY): Payer: Managed Care, Other (non HMO)

## 2019-10-28 ENCOUNTER — Ambulatory Visit (HOSPITAL_COMMUNITY)
Admission: RE | Admit: 2019-10-28 | Discharge: 2019-10-28 | Disposition: A | Discharge status: Home / Self Care | Payer: Managed Care, Other (non HMO) | Source: Ambulatory Visit | Attending: Internal Medicine | Admitting: Internal Medicine

## 2019-10-28 ENCOUNTER — Other Ambulatory Visit: Payer: Self-pay

## 2019-10-28 DIAGNOSIS — E041 Nontoxic single thyroid nodule: Secondary | ICD-10-CM | POA: Insufficient documentation

## 2019-11-11 ENCOUNTER — Encounter (INDEPENDENT_AMBULATORY_CARE_PROVIDER_SITE_OTHER): Payer: Self-pay | Admitting: Internal Medicine

## 2019-11-13 ENCOUNTER — Other Ambulatory Visit (INDEPENDENT_AMBULATORY_CARE_PROVIDER_SITE_OTHER): Payer: Self-pay | Admitting: Internal Medicine

## 2019-11-13 MED ORDER — AMLODIPINE BESYLATE 2.5 MG PO TABS: 2.5000 mg | ORAL_TABLET | Freq: Every day | ORAL | 3 refills | Status: DC

## 2020-01-11 ENCOUNTER — Other Ambulatory Visit: Payer: Self-pay | Admitting: Urology

## 2020-01-12 ENCOUNTER — Other Ambulatory Visit: Payer: Self-pay | Admitting: Urology

## 2020-01-12 LAB — PSA, TOTAL AND FREE
PSA, Free Pct: 9.6 %
PSA, Free: 0.43 ng/mL
Prostate Specific Ag, Serum: 4.5 ng/mL — ABNORMAL HIGH (ref 0.0–4.0)

## 2020-01-16 NOTE — Progress Notes (Signed)
See labs 

## 2020-01-18 ENCOUNTER — Ambulatory Visit: Payer: Managed Care, Other (non HMO) | Admitting: Urology

## 2020-02-27 ENCOUNTER — Other Ambulatory Visit (INDEPENDENT_AMBULATORY_CARE_PROVIDER_SITE_OTHER): Payer: Self-pay | Admitting: Internal Medicine

## 2020-02-29 ENCOUNTER — Ambulatory Visit: Payer: Managed Care, Other (non HMO) | Admitting: Urology

## 2020-04-10 ENCOUNTER — Encounter (INDEPENDENT_AMBULATORY_CARE_PROVIDER_SITE_OTHER): Payer: Self-pay | Admitting: Internal Medicine

## 2020-04-19 ENCOUNTER — Other Ambulatory Visit: Payer: Self-pay

## 2020-04-19 ENCOUNTER — Encounter (INDEPENDENT_AMBULATORY_CARE_PROVIDER_SITE_OTHER): Payer: Self-pay | Admitting: Internal Medicine

## 2020-04-19 ENCOUNTER — Ambulatory Visit (INDEPENDENT_AMBULATORY_CARE_PROVIDER_SITE_OTHER): Payer: Managed Care, Other (non HMO) | Admitting: Internal Medicine

## 2020-04-19 VITALS — BP 130/73 | HR 64 | Temp 97.5°F | Resp 18 | Ht 70.0 in | Wt 197.0 lb

## 2020-04-19 DIAGNOSIS — E559 Vitamin D deficiency, unspecified: Secondary | ICD-10-CM

## 2020-04-19 DIAGNOSIS — I10 Essential (primary) hypertension: Secondary | ICD-10-CM | POA: Diagnosis not present

## 2020-04-19 DIAGNOSIS — R972 Elevated prostate specific antigen [PSA]: Secondary | ICD-10-CM

## 2020-04-19 NOTE — Patient Instructions (Signed)
[facility] Dietary Recommendations for Weight Loss What to Avoid . Avoid added sugars o Often added sugar can be found in processed foods such as many condiments, dry cereals, cakes, cookies, chips, crisps, crackers, candies, sweetened drinks, etc.  o Read labels and AVOID/DECREASE use of foods with the following in their ingredient list: Sugar, fructose, high fructose corn syrup, sucrose, glucose, maltose, dextrose, molasses, cane sugar, brown sugar, any type of syrup, agave nectar, etc.   . Avoid snacking in between meals . Avoid foods made with flour o If you are going to eat food made with flour, choose those made with whole-grains; and, minimize your consumption as much as is tolerable . Avoid processed foods o These foods are generally stocked in the middle of the grocery store. Focus on shopping on the perimeter of the grocery.  . Avoid Meat  o We recommend following a plant-based diet at [facility]. Thus, we recommend avoiding meat as a general rule. Consider eating beans, legumes, eggs, and/or dairy products for regular protein sources o If you plan on eating meat limit to 4 ounces of meat at a time and choose lean options such as Fish, chicken, turkey. Avoid red meat intake such as pork and/or steak What to Include . Vegetables o GREEN LEAFY VEGETABLES: Kale, spinach, mustard greens, collard greens, cabbage, broccoli, etc. o OTHER: Asparagus, cauliflower, eggplant, carrots, peas, Brussel sprouts, tomatoes, bell peppers, zucchini, beets, cucumbers, etc. . Grains, seeds, and legumes o Beans: kidney beans, black eyed peas, garbanzo beans, black beans, pinto beans, etc. o Whole, unrefined grains: brown rice, barley, bulgur, oatmeal, etc. . Healthy fats  o Avoid highly processed fats such as vegetable oil o Examples of healthy fats: avocado, olives, virgin olive oil, dark chocolate (?72% Cocoa), nuts (peanuts, almonds, walnuts, cashews, pecans, etc.) . None to Low  Intake of Animal Sources of Protein o Meat sources: chicken, turkey, salmon, tuna. Limit to 4 ounces of meat at one time. o Consider limiting dairy sources, but when choosing dairy focus on: PLAIN Greek yogurt, cottage cheese, high-protein milk . Fruit o Choose berries  When to Eat . Intermittent Fasting: o Choosing not to eat for a specific time period, but DO FOCUS ON HYDRATION when fasting o Multiple Techniques: - Time Restricted Eating: eat 3 meals in a day, each meal lasting no more than 60 minutes, no snacks between meals - 16-18 hour fast: fast for 16 to 18 hours up to 7 days a week. Often suggested to start with 2-3 nonconsecutive days per week.  . Remember the time you sleep is counted as fasting.  . Examples of eating schedule: Fast from 7:00pm-11:00am. Eat between 11:00am-7:00pm.  - 24-hour fast: fast for 24 hours up to every other day. Often suggested to start with 1 day per week . Remember the time you sleep is counted as fasting . Examples of eating schedule:  o Eating day: eat 2-3 meals on your eating day. If doing 2 meals, each meal should last no more than 90 minutes. If doing 3 meals, each meal should last no more than 60 minutes. Finish last meal by 7:00pm. o Fasting day: Fast until 7:00pm.  o IF YOU FEEL UNWELL FOR ANY REASON/IN ANY WAY WHEN FASTING, STOP FASTING BY EATING A NUTRITIOUS SNACK OR LIGHT MEAL o ALWAYS FOCUS ON HYDRATION DURING FASTS - Acceptable Hydration sources: water, broths, tea/coffee (black tea/coffee is best but using a small amount of whole-fat dairy products in coffee/tea is acceptable).  -   Poor Hydration Sources: anything with sugar or artificial sweeteners added to it  These recommendations have been developed for patients that are actively receiving medical care from either Dr. Ardon Franklin or Sarah Gray, DNP, NP-C at [facility]. These recommendations are developed for patients with specific medical conditions and are not meant to be  distributed or used by others that are not actively receiving care from either provider listed above at [facility]. It is not appropriate to participate in the above eating plans without proper medical supervision.   Reference: Fung, J. The obesity code. [place]: [place]; 2016.   

## 2020-04-19 NOTE — Progress Notes (Signed)
Metrics: Intervention Frequency ACO  Documented Smoking Status Yearly  Screened one or more times in 24 months  Cessation Counseling or  Active cessation medication Past 24 months  Past 24 months   Guideline developer: UpToDate (See UpToDate for funding source) Date Released: 2014       Wellness Office Visit  Subjective:  Patient ID: Scott Mathis, male    DOB: 09-13-1956  Age: 64 y.o. MRN: [id number]  CC: This delightful man comes in for follow-up of hypertension, vitamin D deficiency. HPI  He also has a history of elevated PSA and is being followed by urology. He is now taking low-dose amlodipine 2.5 mg every day and has been checking his blood pressure readings and they have not been optimal so he continues with the same dose.  On the last visit, we discussed the possibility of discontinuing this. He continues to take vitamin D3 he thinks 5000 units daily is recommended on the last visit. He is retired now but keeps himself very active throughout the day.  He goes for walks at least about a mile every day.  He does have workout machines in his home but does not use them on a regular basis. He tries to eat healthier. Past Medical History:  Diagnosis Date  . Colon polyps   . GERD (gastroesophageal reflux disease)   . HLD (hyperlipidemia) 10/20/2019  . Hypertension   . Lymphoma ([place])   . Stroke [facility])       Family History  Problem Relation Age of Onset  . Colon cancer Maternal Aunt   . Colon cancer Maternal Grandfather   . Lymphoma Brother     Social History   Social History Narrative   Married 15 years,second.First marriage lasted 68 years.Retired,but works part-time ,soon to retire completely in March 2021   Social History   Tobacco Use  . Smoking status: Never Smoker  . Smokeless tobacco: Never Used  Substance Use Topics  . Alcohol use: No    Current Meds  Medication Sig  . amLODipine (NORVASC) 2.5 MG tablet TAKE 1 TABLET(2.5 MG) BY MOUTH DAILY  .  Ascorbic Acid (VITAMIN C) 1000 MG tablet Take 1,000 mg by mouth daily.  Marland Kitchen aspirin 325 MG tablet Take 1 tablet (325 mg total) by mouth daily.  . Cholecalciferol (VITAMIN D-3) 125 MCG (5000 UT) TABS Take 1 tablet by mouth daily.  . Multiple Vitamin (MULTI-VITAMINS) TABS Take 1 tablet by mouth daily.   . Omega-3 Fatty Acids (FISH OIL) 1000 MG CAPS Take 1,000 mg by mouth daily.   Marland Kitchen triamcinolone cream (KENALOG) 0.1 % Apply 1 application topically See admin instructions. Apply to affected area once daily for 2 weeks at a time, taking 1 week off in between as needed.  . [DISCONTINUED] Cholecalciferol (VITAMIN D) 50 MCG (2000 UT) CAPS Take by mouth.       Objective:   Today's Vitals: BP 130/73 (BP Location: Left Arm, Patient Position: Sitting, Cuff Size: Normal)   Pulse 64   Temp (!) 97.5 F (36.4 C)   Resp 18   Ht 5\' 10"  (1.778 m)   Wt 197 lb (89.4 kg)   SpO2 98%   BMI 28.27 kg/m  Vitals with BMI 04/19/2020 10/20/2019 01/13/2019  Height 5\' 10"  5\' 10"  -  Weight 197 lbs 197 lbs 13 oz -  BMI [id number] 123456 -  Systolic [id number]  Diastolic 73 80 86  Pulse 64 72 60     Physical Exam  He looks systemically  well.  Blood pressure is well controlled.  Weight is stable.  He is alert and oriented without any obvious focal neurological signs.     Assessment   1. Essential hypertension   2. Vitamin D deficiency disease   3. Elevated PSA       Tests ordered Orders Placed This Encounter  Procedures  . COMPLETE METABOLIC PANEL WITH GFR  . VITAMIN D 25 Hydroxy (Vit-D Deficiency, Fractures)     Plan: 1. Blood work is ordered above. 2. We will continue with amlodipine which seems to be controlling his blood pressure well.  I recommended he do cardiovascular exercise on a more regular basis to increase his heart rate and thereby improve elasticity which will then reduce his blood pressure. 3. He will continue with vitamin D3 5000 units daily and we will check levels today. 4. Further  recommendations will depend on blood results and I will see him in 6 months time for annual physical exam.   No orders of the defined types were placed in this encounter.   Doree Albee, MD

## 2020-04-20 LAB — COMPLETE METABOLIC PANEL WITH GFR
AG Ratio: 2.2 (calc) (ref 1.0–2.5)
ALT: 13 U/L (ref 9–46)
AST: 20 U/L (ref 10–35)
Albumin: 4.4 g/dL (ref 3.6–5.1)
Alkaline phosphatase (APISO): 58 U/L (ref 35–144)
BUN: 15 mg/dL (ref 7–25)
CO2: 31 mmol/L (ref 20–32)
Calcium: 9.5 mg/dL (ref 8.6–10.3)
Chloride: 103 mmol/L (ref 98–110)
Creat: 0.85 mg/dL (ref 0.70–1.25)
GFR, Est African American: 107 mL/min/{1.73_m2} (ref >=60)
GFR, Est Non African American: 93 mL/min/{1.73_m2} (ref >=60)
Globulin: 2 g/dL (calc) (ref 1.9–3.7)
Glucose, Bld: 102 mg/dL — ABNORMAL HIGH (ref 65–99)
Potassium: 3.9 mmol/L (ref 3.5–5.3)
Sodium: 141 mmol/L (ref 135–146)
Total Bilirubin: 0.8 mg/dL (ref 0.2–1.2)
Total Protein: 6.4 g/dL (ref 6.1–8.1)

## 2020-04-20 LAB — VITAMIN D 25 HYDROXY (VIT D DEFICIENCY, FRACTURES): Vit D, 25-Hydroxy: 79 ng/mL (ref 30–100)

## 2020-05-15 ENCOUNTER — Other Ambulatory Visit: Payer: Self-pay

## 2020-05-15 ENCOUNTER — Encounter (HOSPITAL_COMMUNITY): Payer: Self-pay | Admitting: Emergency Medicine

## 2020-05-15 ENCOUNTER — Emergency Department (HOSPITAL_COMMUNITY)
Admission: EM | Admit: 2020-05-15 | Discharge: 2020-05-15 | Disposition: A | Discharge status: Home / Self Care | Payer: Managed Care, Other (non HMO) | Attending: Emergency Medicine | Admitting: Emergency Medicine

## 2020-05-15 DIAGNOSIS — R197 Diarrhea, unspecified: Secondary | ICD-10-CM | POA: Insufficient documentation

## 2020-05-15 DIAGNOSIS — I10 Essential (primary) hypertension: Secondary | ICD-10-CM | POA: Diagnosis not present

## 2020-05-15 DIAGNOSIS — Z7982 Long term (current) use of aspirin: Secondary | ICD-10-CM | POA: Insufficient documentation

## 2020-05-15 DIAGNOSIS — R112 Nausea with vomiting, unspecified: Secondary | ICD-10-CM | POA: Diagnosis present

## 2020-05-15 DIAGNOSIS — Z79899 Other long term (current) drug therapy: Secondary | ICD-10-CM | POA: Insufficient documentation

## 2020-05-15 LAB — URINALYSIS, ROUTINE W REFLEX MICROSCOPIC
Bilirubin Urine: NEGATIVE
Glucose, UA: NEGATIVE mg/dL
Hgb urine dipstick: NEGATIVE
Ketones, ur: NEGATIVE mg/dL
Leukocytes,Ua: NEGATIVE
Nitrite: NEGATIVE
Protein, ur: NEGATIVE mg/dL
Specific Gravity, Urine: 1.019 (ref 1.005–1.030)
pH: 7 (ref 5.0–8.0)

## 2020-05-15 LAB — CBC WITH DIFFERENTIAL/PLATELET
Abs Immature Granulocytes: 0.07 10*3/uL (ref 0.00–0.07)
Basophils Absolute: 0 10*3/uL (ref 0.0–0.1)
Basophils Relative: 0 %
Eosinophils Absolute: 0.1 10*3/uL (ref 0.0–0.5)
Eosinophils Relative: 0 %
HCT: 50 % (ref 39.0–52.0)
Hemoglobin: 16.3 g/dL (ref 13.0–17.0)
Immature Granulocytes: 1 %
Lymphocytes Relative: 2 %
Lymphs Abs: 0.3 10*3/uL — ABNORMAL LOW (ref 0.7–4.0)
MCH: 30.7 pg (ref 26.0–34.0)
MCHC: 32.6 g/dL (ref 30.0–36.0)
MCV: 94.2 fL (ref 80.0–100.0)
Monocytes Absolute: 0.7 10*3/uL (ref 0.1–1.0)
Monocytes Relative: 5 %
Neutro Abs: 11.5 10*3/uL — ABNORMAL HIGH (ref 1.7–7.7)
Neutrophils Relative %: 92 %
Platelets: 207 10*3/uL (ref 150–400)
RBC: 5.31 MIL/uL (ref 4.22–5.81)
RDW: 13.5 % (ref 11.5–15.5)
WBC: 12.5 10*3/uL — ABNORMAL HIGH (ref 4.0–10.5)
nRBC: 0 % (ref 0.0–0.2)

## 2020-05-15 LAB — BASIC METABOLIC PANEL
Anion gap: 11 (ref 5–15)
BUN: 18 mg/dL (ref 8–23)
CO2: 29 mmol/L (ref 22–32)
Calcium: 9 mg/dL (ref 8.9–10.3)
Chloride: 102 mmol/L (ref 98–111)
Creatinine, Ser: 0.87 mg/dL (ref 0.61–1.24)
GFR calc Af Amer: >60 mL/min (ref >60)
GFR calc non Af Amer: >60 mL/min (ref >60)
Glucose, Bld: 149 mg/dL — ABNORMAL HIGH (ref 70–99)
Potassium: 3.9 mmol/L (ref 3.5–5.1)
Sodium: 142 mmol/L (ref 135–145)

## 2020-05-15 MED ORDER — SODIUM CHLORIDE 0.9 % IV BOLUS
1000.0000 mL | Freq: Once | INTRAVENOUS | Status: AC
  Administered 2020-05-15: 1000 mL via INTRAVENOUS

## 2020-05-15 MED ORDER — LOPERAMIDE HCL 2 MG PO CAPS
4.0000 mg | ORAL_CAPSULE | Freq: Once | ORAL | Status: AC
  Administered 2020-05-15: 4 mg via ORAL
  Filled 2020-05-15: qty 2

## 2020-05-15 MED ORDER — ONDANSETRON 4 MG PO TBDP
ORAL_TABLET | ORAL | 0 refills | Status: DC
Start: 2020-05-15 — End: 2023-11-23

## 2020-05-15 MED ORDER — ONDANSETRON HCL 4 MG/2ML IJ SOLN
4.0000 mg | Freq: Once | INTRAMUSCULAR | Status: AC
  Administered 2020-05-15: 4 mg via INTRAVENOUS
  Filled 2020-05-15: qty 2

## 2020-05-15 NOTE — ED Provider Notes (Signed)
Patient CARE signed out to follow-up blood work and reassess. Patient presents with vomiting nausea and diarrhea since this morning.  Patient did have Janine Limbo recently and returned from [place] after eating fish tacos.  Patient feels better on reassessment, tolerating oral fluids.  To liters of IV fluids given.  Blood work results reviewed and no significant abnormalities, mild leukocytosis 12.5.  Urinalysis no acute abnormalities. Discussed reasons to return. Labs Reviewed  BASIC METABOLIC PANEL - Abnormal; Notable for the following components:      Result Value   Glucose, Bld 149 (*)    All other components within normal limits  CBC WITH DIFFERENTIAL/PLATELET - Abnormal; Notable for the following components:   WBC 12.5 (*)    Neutro Abs 11.5 (*)    Lymphs Abs 0.3 (*)    All other components within normal limits  URINALYSIS, ROUTINE W REFLEX MICROSCOPIC   Results and differential diagnosis were discussed with the patient/parent/guardian. Xrays were independently reviewed by myself.  Close follow up outpatient was discussed, comfortable with the plan.   Medications  ondansetron (ZOFRAN) injection 4 mg (4 mg Intravenous Given 05/15/20 0659)  sodium chloride 0.9 % bolus 1,000 mL (0 mLs Intravenous Stopped 05/15/20 0816)  loperamide (IMODIUM) capsule 4 mg (4 mg Oral Given 05/15/20 0658)  sodium chloride 0.9 % bolus 1,000 mL (1,000 mLs Intravenous New Bag/Given 05/15/20 0818)    Vitals:   05/15/20 0730 05/15/20 0800 05/15/20 0830 05/15/20 0900  BP: 135/77 133/73 128/74 130/80  Pulse: 68 70 69 76  Resp:      Temp:      TempSrc:      SpO2: 98% 98% 100% 100%  Weight:      Height:        Final diagnoses:  Nausea vomiting and diarrhea      Elnora Morrison, MD 05/15/20 [phone]

## 2020-05-15 NOTE — ED Provider Notes (Signed)
[facility] EMERGENCY DEPARTMENT Provider Note   CSN: [id number] Arrival date & time: 05/15/20  [id number]   History Chief Complaint  Patient presents with  . Emesis  . Diarrhea    Scott Mathis is a 64 y.o. male.  The history is provided by the patient.  Emesis Associated symptoms: diarrhea   Diarrhea Associated symptoms: vomiting   He has history of hypertension, hyperlipidemia, stroke, lymphoma and comes in because of vomiting and diarrhea since 1 AM.  He has vomited 3 times and had 3 watery bowel movements.  He denies abdominal pain and denies fever, chills, sweats.  He is feeling dizzy and lightheaded.  He denies any sick contacts.  He ate at [employer] yesterday, but other people who ate there have not gotten sick.  Past Medical History:  Diagnosis Date  . Colon polyps   . GERD (gastroesophageal reflux disease)   . HLD (hyperlipidemia) 10/20/2019  . Hypertension   . Lymphoma ([place])   . Stroke [facility])     Patient Active Problem List   Diagnosis Date Noted  . HLD (hyperlipidemia) 10/20/2019  . Gastroesophageal reflux disease without esophagitis 07/21/2018  . Stroke ([place]) 01/30/2018  . Lymphoma ([place])   . Hypertension   . Family history of colon cancer 12/30/2017  . Mycosis fungoides ([place]) 12/08/2017    Past Surgical History:  Procedure Laterality Date  . BIOPSY  09/09/2018   Procedure: BIOPSY;  Surgeon: Rogene Houston, MD;  Location: [facility] ENDO [facility];  Service: Endoscopy;;  gastric polyps  . Colonoscopy    . COLONOSCOPY    . COLONOSCOPY    . COLONOSCOPY N/A 05/12/2018   Procedure: COLONOSCOPY;  Surgeon: Rogene Houston, MD;  Location: [facility] ENDO [facility];  Service: Endoscopy;  Laterality: N/A;  930  . ESOPHAGOGASTRODUODENOSCOPY N/A 09/09/2018   Procedure: ESOPHAGOGASTRODUODENOSCOPY (EGD);  Surgeon: Rogene Houston, MD;  Location: [facility] ENDO [facility];  Service: Endoscopy;  Laterality: N/A;  2:45  . Right shoulder surgery    . TONSILLECTOMY         Family History    Problem Relation Age of Onset  . Colon cancer Maternal Aunt   . Colon cancer Maternal Grandfather   . Lymphoma Brother     Social History   Tobacco Use  . Smoking status: Never Smoker  . Smokeless tobacco: Never Used  Substance Use Topics  . Alcohol use: No  . Drug use: No    Home Medications Prior to Admission medications   Medication Sig Start Date End Date Taking? Authorizing Provider  amLODipine (NORVASC) 2.5 MG tablet TAKE 1 TABLET(2.5 MG) BY MOUTH DAILY 02/27/20   Hurshel Party C, MD  Ascorbic Acid (VITAMIN C) 1000 MG tablet Take 1,000 mg by mouth daily.    [provider]  aspirin 325 MG tablet Take 1 tablet (325 mg total) by mouth daily. 02/02/18   Mikhail, Velta Addison, DO  Cholecalciferol (VITAMIN D-3) 125 MCG (5000 UT) TABS Take 1 tablet by mouth daily.    [provider]  Multiple Vitamin (MULTI-VITAMINS) TABS Take 1 tablet by mouth daily.     [provider]  Omega-3 Fatty Acids (FISH OIL) 1000 MG CAPS Take 1,000 mg by mouth daily.     [provider]  triamcinolone cream (KENALOG) 0.1 % Apply 1 application topically See admin instructions. Apply to affected area once daily for 2 weeks at a time, taking 1 week off in between as needed.    [provider]  amLODipine (NORVASC) 2.5  MG tablet Take 1 tablet (2.5 mg total) by mouth daily. 11/13/19   Hurshel Party C, MD  amLODipine (NORVASC) 5 MG tablet Take 5 mg by mouth daily.  02/10/18   [provider]    Allergies    Patient has no known allergies.  Review of Systems   Review of Systems  Gastrointestinal: Positive for diarrhea and vomiting.  All other systems reviewed and are negative.   Physical Exam Updated Vital Signs BP 138/84 (BP Location: Right Arm)   Pulse 75   Temp 97.6 F (36.4 C) (Oral)   Resp 18   Ht 5\' 10"  (1.778 m)   Wt 86.2 kg   SpO2 98%   BMI 27.26 kg/m   Physical Exam Vitals and nursing note reviewed.   64 year old male, resting  comfortably and in no acute distress. Vital signs are normal. Oxygen saturation is 98%, which is normal. Head is normocephalic and atraumatic. PERRLA, EOMI. Oropharynx is clear. Neck is nontender and supple without adenopathy or JVD. Back is nontender and there is no CVA tenderness. Lungs are clear without rales, wheezes, or rhonchi. Chest is nontender. Heart has regular rate and rhythm without murmur. Abdomen is soft, flat, nontender without masses or hepatosplenomegaly and peristalsis is hypoactive. Extremities have no cyanosis or edema, full range of motion is present. Skin is warm and dry without rash. Neurologic: Mental status is normal, cranial nerves are intact, there are no motor or sensory deficits.  ED Results / Procedures / Treatments   Labs (all labs ordered are listed, but only abnormal results are displayed) Labs Reviewed  BASIC METABOLIC PANEL  CBC WITH DIFFERENTIAL/PLATELET   Procedures Procedures   Medications Ordered in ED Medications  ondansetron (ZOFRAN) injection 4 mg (has no administration in time range)  sodium chloride 0.9 % bolus 1,000 mL (has no administration in time range)  loperamide (IMODIUM) capsule 4 mg (has no administration in time range)    ED Course  I have reviewed the triage vital signs and the nursing notes.  Pertinent labs & imaging results that were available during my care of the patient were reviewed by me and considered in my medical decision making (see chart for details).  MDM Rules/Calculators/A&P Nausea, vomiting, diarrhea and pattern most consistent with viral gastroenteritis.  No red flags to suggest more serious pathology.  He does not appear overtly dehydrated but is dizzy.  Will give IV fluids and check CBC and metabolic panel.  He will be given ondansetron for nausea, loperamide for diarrhea.  Old records are reviewed, and he has no relevant past visits.  Case is signed out to Dr. Reather Converse.  Final Clinical Impression(s) / ED  Diagnoses Final diagnoses:  Nausea vomiting and diarrhea    Rx / DC Orders ED Discharge Orders    None       Delora Fuel, MD [id number] [phone]

## 2020-05-15 NOTE — ED Triage Notes (Signed)
Pt c/o of emesis and diarrhea since 1am this morning.

## 2020-05-15 NOTE — Discharge Instructions (Signed)
Use Zofran as needed for nausea and vomiting. Gradually increase oral than soft diet intake. Return for new or worsening signs or symptoms as discussed.

## 2020-06-19 ENCOUNTER — Other Ambulatory Visit (INDEPENDENT_AMBULATORY_CARE_PROVIDER_SITE_OTHER): Payer: Self-pay | Admitting: Internal Medicine

## 2020-07-04 ENCOUNTER — Other Ambulatory Visit: Payer: Self-pay | Admitting: Urology

## 2020-07-04 DIAGNOSIS — R972 Elevated prostate specific antigen [PSA]: Secondary | ICD-10-CM

## 2020-07-06 ENCOUNTER — Other Ambulatory Visit: Payer: Self-pay | Admitting: Urology

## 2020-07-07 LAB — PSA: Prostate Specific Ag, Serum: 5.9 ng/mL — ABNORMAL HIGH (ref 0.0–4.0)

## 2020-07-16 ENCOUNTER — Telehealth: Payer: Self-pay

## 2020-07-16 NOTE — Telephone Encounter (Signed)
-----   Message from Cleon Gustin, MD sent at 07/13/2020  9:07 AM EDT ----- PSA increased to 5.9. He needs to see me to discuss prostate biopsy ----- Message ----- From: Dorisann Frames, RN Sent: 07/09/2020   7:55 AM EDT To: Cleon Gustin, MD  Please review

## 2020-07-16 NOTE — Telephone Encounter (Signed)
Pt called and notified. Has appt August 5th.

## 2020-07-18 ENCOUNTER — Ambulatory Visit: Payer: Managed Care, Other (non HMO) | Admitting: Urology

## 2020-07-26 ENCOUNTER — Encounter: Payer: Self-pay | Admitting: Urology

## 2020-07-26 ENCOUNTER — Other Ambulatory Visit: Payer: Self-pay

## 2020-07-26 ENCOUNTER — Ambulatory Visit (INDEPENDENT_AMBULATORY_CARE_PROVIDER_SITE_OTHER): Payer: Managed Care, Other (non HMO) | Admitting: Urology

## 2020-07-26 VITALS — BP 137/84 | HR 67 | Temp 97.5°F | Ht 70.0 in | Wt 185.0 lb

## 2020-07-26 DIAGNOSIS — R972 Elevated prostate specific antigen [PSA]: Secondary | ICD-10-CM

## 2020-07-26 LAB — URINALYSIS, ROUTINE W REFLEX MICROSCOPIC
Bilirubin, UA: NEGATIVE
Glucose, UA: NEGATIVE
Ketones, UA: NEGATIVE
Leukocytes,UA: NEGATIVE
Nitrite, UA: NEGATIVE
Protein,UA: NEGATIVE
RBC, UA: NEGATIVE
Specific Gravity, UA: 1.02 (ref 1.005–1.030)
Urobilinogen, Ur: 0.2 mg/dL (ref 0.2–1.0)
pH, UA: 6.5 (ref 5.0–7.5)

## 2020-07-26 NOTE — Progress Notes (Signed)
Urological Symptom Review ° °Patient is experiencing the following symptoms: °Get up at night to urinate ° ° °Review of Systems ° °Gastrointestinal (upper)  : °Negative for upper GI symptoms ° °Gastrointestinal (lower) : °Negative for lower GI symptoms ° °Constitutional : °Weight loss ° °Skin: °Negative for skin symptoms ° °Eyes: °Negative for eye symptoms ° °Ear/Nose/Throat : °Negative for Ear/Nose/Throat symptoms ° °Hematologic/Lymphatic: °Negative for Hematologic/Lymphatic symptoms ° °Cardiovascular : °Negative for cardiovascular symptoms ° °Respiratory : °Negative for respiratory symptoms ° °Endocrine: °Negative for endocrine symptoms ° °Musculoskeletal: °Negative for musculoskeletal symptoms ° °Neurological: °Negative for neurological symptoms ° °Psychologic: °Negative for psychiatric symptoms ° °

## 2020-07-26 NOTE — Progress Notes (Signed)
07/26/2020 12:03 PM   Federico Flake [birth year] [id number]  Referring provider: Doree Albee, MD [place],  [place] [address]  Elevated PSA  HPI: Mr Brockmann is a 64yo with a hx of elevated PSA here for followup. PSA increased to 5.9 from 4.5. No worsening LUTS. Nocturia 1x.   His records from [facility] are as follows: My PSA is elevated above the normal range.  HPI: Demont Linford is a 65 year-old male established patient who is here for an elevated PSA.  His PSA is 3.9. He has not had PSA's drawn prior to this one. When the elevated PSA was drawn, he reports having chills. He has not had a prostate nodule on a physical examination. He has not had recurrent prostate infections or chronic prostatitis.   He has not been on antibiotics for prostate infections previously. He has not had a prostate biopsy done.   04/07/2018: PSA from 12/2017 was 3.9 with a 9% free. He denies any LUTS. no ED. NO hematuria. He had a TIA in 01/2018. he is on ASA.  He was recently diagnosed with T-cell lymphoma.   10/13/2018: PSA is stable at 3.8 with 9.5% free. no new LUTS. He is currently undergoing therapy for T-cell lymphoma   06/29/2019: PSA increased to 4.4. No recent treatments for T cell lymphoma. He had intercourse prior to his PSa being drawn     PMH: Past Medical History:  Diagnosis Date  . Colon polyps   . GERD (gastroesophageal reflux disease)   . HLD (hyperlipidemia) 10/20/2019  . Hypertension   . Lymphoma ([place])   . Stroke [facility])     Surgical History: Past Surgical History:  Procedure Laterality Date  . BIOPSY  09/09/2018   Procedure: BIOPSY;  Surgeon: Rogene Houston, MD;  Location[facility];  Service: Endoscopy;;  gastric polyps  . Colonoscopy    . COLONOSCOPY    . COLONOSCOPY    . COLONOSCOPY N/A 05/12/2018   Procedure: COLONOSCOPY;  Surgeon: Rogene Houston, MD;  [facility];  Service: Endoscopy;  Laterality: N/A;  930  .  ESOPHAGOGASTRODUODENOSCOPY N/A 09/09/2018   Procedure: ESOPHAGOGASTRODUODENOSCOPY (EGD);  Surgeon: Rogene Houston, MD;  Location[facility];  Service: Endoscopy;  Laterality: N/A;  2:45  . Right shoulder surgery    . TONSILLECTOMY      Home Medications:  Allergies as of 07/26/2020   No Known Allergies     Medication List       Accurate as of July 26, 2020 12:03 PM. If you have any questions, ask your nurse or doctor.        amLODipine 2.5 MG tablet Commonly known as: NORVASC TAKE 1 TABLET(2.5 MG) BY MOUTH DAILY   aspirin 325 MG tablet Take 1 tablet (325 mg total) by mouth daily.   Fish Oil 1000 MG Caps Take 1,000 mg by mouth daily.   Multi-Vitamins Tabs Take 1 tablet by mouth daily.   ondansetron 4 MG disintegrating tablet Commonly known as: Zofran ODT 4mg  ODT q4 hours prn nausea/vomit   triamcinolone cream 0.1 % Commonly known as: KENALOG Apply 1 application topically See admin instructions. Apply to affected area once daily for 2 weeks at a time, taking 1 week off in between as needed.   vitamin C 1000 MG tablet Take 1,000 mg by mouth daily.   Vitamin D-3 125 MCG (5000 UT) Tabs Take 1 tablet by mouth daily.       Allergies:  No Known Allergies  Family History: Family History  Problem Relation Age of Onset  . Colon cancer Maternal Aunt   . Colon cancer Maternal Grandfather   . Lymphoma Brother     Social History:  reports that he has never smoked. He has never used smokeless tobacco. He reports that he does not drink alcohol and does not use drugs.  ROS: All other review of systems were reviewed and are negative except what is noted above in HPI  Physical Exam: BP 137/84   Pulse 67   Temp (!) 97.5 F (36.4 C)   Ht 5\' 10"  (1.778 m)   Wt 185 lb (83.9 kg)   BMI 26.54 kg/m   Constitutional:  Alert and oriented, No acute distress. HEENT: [place] AT, moist mucus membranes.  Trachea midline, no masses. Cardiovascular: No clubbing, cyanosis, or  edema. Respiratory: Normal respiratory effort, no increased work of breathing. GI: Abdomen is soft, nontender, nondistended, no abdominal masses GU: No CVA tenderness.  Lymph: No cervical or inguinal lymphadenopathy. Skin: No rashes, bruises or suspicious lesions. Neurologic: Grossly intact, no focal deficits, moving all 4 extremities. Psychiatric: Normal mood and affect.  Laboratory Data: Lab Results  Component Value Date   WBC 12.5 (H) 05/15/2020   HGB 16.3 05/15/2020   HCT 50.0 05/15/2020   MCV 94.2 05/15/2020   PLT 207 05/15/2020    Lab Results  Component Value Date   CREATININE 0.87 05/15/2020    No results found for: PSA  No results found for: TESTOSTERONE  Lab Results  Component Value Date   HGBA1C 5.2 02/01/2018    Urinalysis    Component Value Date/Time   COLORURINE YELLOW 05/15/2020 0747   APPEARANCEUR Clear 07/26/2020 1131   LABSPEC 1.019 05/15/2020 [place] 7.0 05/15/2020 0747   GLUCOSEU Negative 07/26/2020 1131   HGBUR NEGATIVE 05/15/2020 0747   BILIRUBINUR Negative 07/26/2020 [place] 05/15/2020 0747   PROTEINUR Negative 07/26/2020 [place] 05/15/2020 0747   NITRITE Negative 07/26/2020 1131   NITRITE NEGATIVE 05/15/2020 0747   LEUKOCYTESUR Negative 07/26/2020 [place] 05/15/2020 0747    Lab Results  Component Value Date   LABMICR Comment 07/26/2020    Pertinent Imaging:  No results found for this or any previous visit.  No results found for this or any previous visit.  No results found for this or any previous visit.  No results found for this or any previous visit.  No results found for this or any previous visit.  No results found for this or any previous visit.  No results found for this or any previous visit.  No results found for this or any previous visit.   Assessment & Plan:    1. Elevated PSA -PSA today, will call with results. If elevated Patient will need  a prostate biopsy. He will be establishing care in [place] and we will send records to his new Urologist in [place] - Urinalysis, Routine w reflex microscopic   No follow-ups on file.  Nicolette Bang, MD  [facility] [place]

## 2020-07-26 NOTE — Patient Instructions (Signed)
Prostate Cancer Screening  Prostate cancer screening is a test that is done to check for the presence of prostate cancer in men. The prostate gland is a walnut-sized gland that is located below the bladder and in front of the rectum in males. The function of the prostate is to add fluid to semen during ejaculation. Prostate cancer is the second most common type of cancer in men. Who should have prostate cancer screening?  Screening recommendations vary based on age and other risk factors. Screening is recommended if:  You are older than age 55. If you are age 55-69, talk with your health care provider about your need for screening and how often screening should be done. Because most prostate cancers are slow growing and will not cause death, screening is generally reserved in this age group for men who have a 10-15-year life expectancy.  You are younger than age 55, and you have these risk factors: ? Being a black male or a male of African descent. ? Having a father, brother, or uncle who has been diagnosed with prostate cancer. The risk is higher if your family member's cancer occurred at an early age. Screening is not recommended if:  You are younger than age 40.  You are between the ages of 40 and 54 and you have no risk factors.  You are 70 years of age or older. At this age, the risks that screening can cause are greater than the benefits that it may provide. If you are at high risk for prostate cancer, your health care provider may recommend that you have screenings more often or that you start screening at a younger age. How is screening for prostate cancer done? The recommended prostate cancer screening test is a blood test called the prostate-specific antigen (PSA) test. PSA is a protein that is made in the prostate. As you age, your prostate naturally produces more PSA. Abnormally high PSA levels may be caused by:  Prostate cancer.  An enlarged prostate that is not caused by cancer  (benign prostatic hyperplasia, BPH). This condition is very common in older men.  A prostate gland infection (prostatitis). Depending on the PSA results, you may need more tests, such as:  A physical exam to check the size of your prostate gland.  Blood and imaging tests.  A procedure to remove tissue samples from your prostate gland for testing (biopsy). What are the benefits of prostate cancer screening?  Screening can help to identify cancer at an early stage, before symptoms start and when the cancer can be treated more easily.  There is a small chance that screening may lower your risk of dying from prostate cancer. The chance is small because prostate cancer is a slow-growing cancer, and most men with prostate cancer die from a different cause. What are the risks of prostate cancer screening? The main risk of prostate cancer screening is diagnosing and treating prostate cancer that would never have caused any symptoms or problems. This is called overdiagnosisand overtreatment. PSA screening cannot tell you if your PSA is high due to cancer or a different cause. A prostate biopsy is the only procedure to diagnose prostate cancer. Even the results of a biopsy may not tell you if your cancer needs to be treated. Slow-growing prostate cancer may not need any treatment other than monitoring, so diagnosing and treating it may cause unnecessary stress or other side effects. A prostate biopsy may also cause:  Infection or fever.  A false negative. This is   a result that shows that you do not have prostate cancer when you actually do have prostate cancer. Questions to ask your health care provider  When should I start prostate cancer screening?  What is my risk for prostate cancer?  How often do I need screening?  What type of screening tests do I need?  How do I get my test results?  What do my results mean?  Do I need treatment? Where to find more information  The American Cancer  Society: www.cancer.org  American Urological Association: www.auanet.org Contact a health care provider if:  You have difficulty urinating.  You have pain when you urinate or ejaculate.  You have blood in your urine or semen.  You have pain in your back or in the area of your prostate. Summary  Prostate cancer is a common type of cancer in men. The prostate gland is located below the bladder and in front of the rectum. This gland adds fluid to semen during ejaculation.  Prostate cancer screening may identify cancer at an early stage, when the cancer can be treated more easily.  The prostate-specific antigen (PSA) test is the recommended screening test for prostate cancer.  Discuss the risks and benefits of prostate cancer screening with your health care provider. If you are age 70 or older, the risks that screening can cause are greater than the benefits that it may provide. This information is not intended to replace advice given to you by your health care provider. Make sure you discuss any questions you have with your health care provider. Document Revised: 07/21/2019 Document Reviewed: 07/21/2019 Elsevier Patient Education  2020 Elsevier Inc.  

## 2020-07-27 LAB — PSA: Prostate Specific Ag, Serum: 5.4 ng/mL — ABNORMAL HIGH (ref 0.0–4.0)

## 2020-07-31 ENCOUNTER — Telehealth: Payer: Self-pay

## 2020-08-01 ENCOUNTER — Other Ambulatory Visit: Payer: Self-pay | Admitting: Urology

## 2020-08-01 ENCOUNTER — Other Ambulatory Visit: Payer: Self-pay

## 2020-08-01 ENCOUNTER — Other Ambulatory Visit (HOSPITAL_COMMUNITY): Payer: Self-pay | Admitting: Urology

## 2020-08-01 DIAGNOSIS — R972 Elevated prostate specific antigen [PSA]: Secondary | ICD-10-CM

## 2020-08-01 MED ORDER — LEVOFLOXACIN 750 MG PO TABS
750.0000 mg | ORAL_TABLET | Freq: Every day | ORAL | 0 refills | Status: DC
Start: 2020-08-29 — End: 2023-11-23

## 2020-08-06 NOTE — Telephone Encounter (Signed)
Spoke with pt and he is aware of biopsy date, time, and instructions.

## 2020-08-06 NOTE — Telephone Encounter (Signed)
-----   Message from Cleon Gustin, MD sent at 07/31/2020  9:05 AM EDT ----- PSA remains elevated. He will need a prostate biopsy ----- Message ----- From: Iris Pert, LPN Sent: 04/24/5624   8:30 AM EDT To: Cleon Gustin, MD  Please review

## 2020-08-22 ENCOUNTER — Other Ambulatory Visit: Payer: Managed Care, Other (non HMO) | Admitting: Urology

## 2020-08-22 DIAGNOSIS — C61 Malignant neoplasm of prostate: Secondary | ICD-10-CM

## 2020-08-22 HISTORY — DX: Malignant neoplasm of prostate: C61

## 2020-08-29 ENCOUNTER — Other Ambulatory Visit: Payer: Self-pay | Admitting: Urology

## 2020-08-29 ENCOUNTER — Ambulatory Visit (INDEPENDENT_AMBULATORY_CARE_PROVIDER_SITE_OTHER): Payer: Managed Care, Other (non HMO) | Admitting: Urology

## 2020-08-29 ENCOUNTER — Other Ambulatory Visit: Payer: Self-pay

## 2020-08-29 ENCOUNTER — Ambulatory Visit (HOSPITAL_COMMUNITY)
Admission: RE | Admit: 2020-08-29 | Discharge: 2020-08-29 | Disposition: A | Discharge status: Home / Self Care | Payer: Managed Care, Other (non HMO) | Source: Ambulatory Visit | Attending: Urology | Admitting: Urology

## 2020-08-29 DIAGNOSIS — R972 Elevated prostate specific antigen [PSA]: Secondary | ICD-10-CM

## 2020-08-29 MED ORDER — CEFTRIAXONE SODIUM 1 G IJ SOLR
INTRAMUSCULAR | Status: AC
  Administered 2020-08-29: 1 g via INTRAMUSCULAR
  Filled 2020-08-29: qty 10

## 2020-08-29 MED ORDER — LIDOCAINE HCL (PF) 2 % IJ SOLN
INTRAMUSCULAR | Status: AC
  Filled 2020-08-29: qty 10

## 2020-08-29 MED ORDER — CEFTRIAXONE SODIUM 1 G IJ SOLR: 1.0000 g | Freq: Once | INTRAMUSCULAR | Status: AC

## 2020-08-29 MED ORDER — LIDOCAINE HCL (PF) 1 % IJ SOLN
INTRAMUSCULAR | Status: AC
  Filled 2020-08-29: qty 5

## 2020-08-29 MED ORDER — GENTAMICIN SULFATE 40 MG/ML IJ SOLN
INTRAMUSCULAR | Status: AC
  Filled 2020-08-29: qty 2

## 2020-08-31 ENCOUNTER — Encounter: Payer: Self-pay | Admitting: Urology

## 2020-08-31 ENCOUNTER — Telehealth (INDEPENDENT_AMBULATORY_CARE_PROVIDER_SITE_OTHER): Payer: Managed Care, Other (non HMO) | Admitting: Urology

## 2020-08-31 DIAGNOSIS — C61 Malignant neoplasm of prostate: Secondary | ICD-10-CM | POA: Insufficient documentation

## 2020-08-31 NOTE — Patient Instructions (Signed)

## 2020-08-31 NOTE — Progress Notes (Signed)
Prostate Biopsy Procedure   Informed consent was obtained after discussing risks/benefits of the procedure.  A time out was performed to ensure correct patient identity.  Pre-Procedure: - Last PSA Level: No results found for: "PSA" - Gentamicin given prophylactically - Levaquin 500 mg administered PO -Transrectal Ultrasound performed revealing a 36.4 gm prostate -No significant hypoechoic or median lobe noted  Procedure: - Prostate block performed using 10 cc 1% lidocaine and biopsies taken from sextant areas, a total of 12 under ultrasound guidance.  Post-Procedure: - Patient tolerated the procedure well - He was counseled to seek immediate medical attention if experiences any severe pain, significant bleeding, or fevers - Return in one week to discuss biopsy results  

## 2020-08-31 NOTE — Patient Instructions (Signed)
Prostate Cancer  The prostate is a male gland that helps make semen. Prostate cancer is when abnormal cells grow in this gland. Follow these instructions at home:  Take over-the-counter and prescription medicines only as told by your doctor.  Eat a healthy diet.  Get plenty of sleep.  Ask your doctor for help to find a support group for men with prostate cancer.  Keep all follow-up visits as told by your doctor. This is important.  If you have to go to the hospital, let your cancer doctor (oncologist) know.  Touch, hold, hug, and caress your partner to continue to show sexual feelings. Contact a doctor if:  You have trouble peeing (urinating).  You have blood in your pee (urine).  You have pain in your hips, back, or chest. Get help right away if:  You have weakness in your legs.  You lose feeling (have numbness) in your legs.  You cannot control your pee or your poop (stool).  You have trouble breathing.  You have sudden pain in your chest.  You have chills or a fever. Summary  The prostate is a male gland that helps make semen. Prostate cancer is when abnormal cells grow in this gland.  Ask your doctor for help to find a support group for men with prostate cancer.  Contact a doctor if you have problems peeing or have any new pain that you did not have before. This information is not intended to replace advice given to you by your health care provider. Make sure you discuss any questions you have with your health care provider. Document Revised: 11/20/2017 Document Reviewed: 08/18/2016 Elsevier Patient Education  2020 Elsevier Inc.  

## 2020-08-31 NOTE — Progress Notes (Signed)
08/31/2020 2:56 PM   Scott Mathis [birth year] [id number]  Referring provider: Doree Albee, MD [address] [place],  [place] [address]  Followup prostate biopsy  HPI: [name] is a 64yo seen today via telehealth for a biopy disucssion. He underwent prostate biopsy Wednesday. Biopsy revealed Gleason 3+3=6 in 3/12 cores and Gleason 3+4=7 in 2/12 cores all on the left. PSA is 5.4. He has mild LUTS. No erectile dysfunction.   I connected with  Scott Mathis on 08/31/20 by a video enabled telemedicine application and verified that I am speaking with the correct person using two identifiers.   I discussed the limitations of evaluation and management by telemedicine. The patient expressed understanding and agreed to proceed. Total length of time spent on call 21 minutes.    PMH: Past Medical History:  Diagnosis Date  . Colon polyps   . GERD (gastroesophageal reflux disease)   . HLD (hyperlipidemia) 10/20/2019  . Hypertension   . Lymphoma ([place])   . Stroke [facility])     Surgical History: Past Surgical History:  Procedure Laterality Date  . BIOPSY  09/09/2018   Procedure: BIOPSY;  Surgeon: Rogene Houston, MD;  [facility];  Service: Endoscopy;;  gastric polyps  . Colonoscopy    . COLONOSCOPY    . COLONOSCOPY    . COLONOSCOPY N/A 05/12/2018   Procedure: COLONOSCOPY;  Surgeon: Rogene Houston, MD;  [facility];  Service: Endoscopy;  Laterality: N/A;  930  . ESOPHAGOGASTRODUODENOSCOPY N/A 09/09/2018   Procedure: ESOPHAGOGASTRODUODENOSCOPY (EGD);  Surgeon: Rogene Houston, MD;  [facility];  Service: Endoscopy;  Laterality: N/A;  2:45  . Right shoulder surgery    . TONSILLECTOMY      Home Medications:  Allergies as of 08/31/2020   No Known Allergies     Medication List       Accurate as of August 31, 2020  2:56 PM. If you have any questions, ask your nurse or doctor.        amLODipine 2.5 MG tablet Commonly known  as: NORVASC TAKE 1 TABLET(2.5 MG) BY MOUTH DAILY   aspirin 325 MG tablet Take 1 tablet (325 mg total) by mouth daily.   Fish Oil 1000 MG Caps Take 1,000 mg by mouth daily.   levofloxacin 750 MG tablet Commonly known as: Levaquin Take 1 tablet (750 mg total) by mouth daily. Take the morning of biopsy   Multi-Vitamins Tabs Take 1 tablet by mouth daily.   ondansetron 4 MG disintegrating tablet Commonly known as: Zofran ODT 4mg  ODT q4 hours prn nausea/vomit   triamcinolone cream 0.1 % Commonly known as: KENALOG Apply 1 application topically See admin instructions. Apply to affected area once daily for 2 weeks at a time, taking 1 week off in between as needed.   vitamin C 1000 MG tablet Take 1,000 mg by mouth daily.   Vitamin D-3 125 MCG (5000 UT) Tabs Take 1 tablet by mouth daily.       Allergies: No Known Allergies  Family History: Family History  Problem Relation Age of Onset  . Colon cancer Maternal Aunt   . Colon cancer Maternal Grandfather   . Lymphoma Brother     Social History:  reports that he has never smoked. He has never used smokeless tobacco. He reports that he does not drink alcohol and does not use drugs.  ROS: All other review of systems were reviewed and are negative except what is noted  above in HPI  Physical Exam: There were no vitals taken for this visit.  Constitutional:  Alert and oriented, No acute distress. HEENT: [place] AT, moist mucus membranes.  Trachea midline, no masses. Cardiovascular: No clubbing, cyanosis, or edema. Respiratory: Normal respiratory effort, no increased work of breathing. GI: Abdomen is soft, nontender, nondistended, no abdominal masses GU: No CVA tenderness.  Lymph: No cervical or inguinal lymphadenopathy. Skin: No rashes, bruises or suspicious lesions. Neurologic: Grossly intact, no focal deficits, moving all 4 extremities. Psychiatric: Normal mood and affect.  Laboratory Data: Lab Results  Component Value Date    WBC 12.5 (H) 05/15/2020   HGB 16.3 05/15/2020   HCT 50.0 05/15/2020   MCV 94.2 05/15/2020   PLT 207 05/15/2020    Lab Results  Component Value Date   CREATININE 0.87 05/15/2020    No results found for: PSA  No results found for: TESTOSTERONE  Lab Results  Component Value Date   HGBA1C 5.2 02/01/2018    Urinalysis    Component Value Date/Time   COLORURINE YELLOW 05/15/2020 0747   APPEARANCEUR Clear 07/26/2020 1131   LABSPEC 1.019 05/15/2020 [facility] 7.0 05/15/2020 0747   GLUCOSEU Negative 07/26/2020 1131   HGBUR NEGATIVE 05/15/2020 0747   BILIRUBINUR Negative 07/26/2020 [place] 05/15/2020 0747   PROTEINUR Negative 07/26/2020 [place] 05/15/2020 0747   NITRITE Negative 07/26/2020 1131   NITRITE NEGATIVE 05/15/2020 0747   LEUKOCYTESUR Negative 07/26/2020 [place] 05/15/2020 0747    Lab Results  Component Value Date   LABMICR Comment 07/26/2020    Pertinent Imaging:  No results found for this or any previous visit.  No results found for this or any previous visit.  No results found for this or any previous visit.  No results found for this or any previous visit.  No results found for this or any previous visit.  No results found for this or any previous visit.  No results found for this or any previous visit.  No results found for this or any previous visit.   Assessment & Plan:   1. Prostate Cancer I discussed the natural history of intermediate risk prostate cancer with the patient and the various treatment options including active surveillance, RALP, IMRT, brachytherapy, cryotherapy, HIFU and ADT. The patient was counseled to find a Urologist in [place] where he has relocated ASAP No follow-ups on file.  Nicolette Bang, MD  [facility] [place]

## 2020-10-20 ENCOUNTER — Other Ambulatory Visit (INDEPENDENT_AMBULATORY_CARE_PROVIDER_SITE_OTHER): Payer: Self-pay | Admitting: Internal Medicine

## 2020-10-22 ENCOUNTER — Encounter (INDEPENDENT_AMBULATORY_CARE_PROVIDER_SITE_OTHER): Payer: Managed Care, Other (non HMO) | Admitting: Internal Medicine

## 2020-11-23 ENCOUNTER — Other Ambulatory Visit (INDEPENDENT_AMBULATORY_CARE_PROVIDER_SITE_OTHER): Payer: Self-pay | Admitting: Nurse Practitioner

## 2021-04-28 ENCOUNTER — Other Ambulatory Visit (INDEPENDENT_AMBULATORY_CARE_PROVIDER_SITE_OTHER): Payer: Self-pay | Admitting: Internal Medicine

## 2022-02-19 HISTORY — PX: PROSTATE BIOPSY: SHX241

## 2022-11-01 ENCOUNTER — Encounter (INDEPENDENT_AMBULATORY_CARE_PROVIDER_SITE_OTHER): Payer: Self-pay | Admitting: Gastroenterology

## 2023-04-16 ENCOUNTER — Encounter (INDEPENDENT_AMBULATORY_CARE_PROVIDER_SITE_OTHER): Payer: Self-pay | Admitting: *Deleted

## 2023-05-23 HISTORY — PX: ENDOSCOPIC MUCOSAL RESECTION: SHX6839

## 2023-11-23 ENCOUNTER — Encounter (INDEPENDENT_AMBULATORY_CARE_PROVIDER_SITE_OTHER): Payer: Self-pay | Admitting: Gastroenterology

## 2023-11-23 ENCOUNTER — Ambulatory Visit (INDEPENDENT_AMBULATORY_CARE_PROVIDER_SITE_OTHER): Payer: Medicare Other | Admitting: Gastroenterology

## 2023-11-23 VITALS — BP 152/82 | HR 64 | Temp 97.8°F | Ht 70.0 in | Wt 217.9 lb

## 2023-11-23 DIAGNOSIS — Z860100 Personal history of colon polyps, unspecified: Secondary | ICD-10-CM

## 2023-11-23 DIAGNOSIS — Z860101 Personal history of adenomatous and serrated colon polyps: Secondary | ICD-10-CM | POA: Diagnosis not present

## 2023-11-23 DIAGNOSIS — K862 Cyst of pancreas: Secondary | ICD-10-CM

## 2023-11-23 DIAGNOSIS — K219 Gastro-esophageal reflux disease without esophagitis: Secondary | ICD-10-CM

## 2023-11-23 DIAGNOSIS — Z800 Family history of malignant neoplasm of digestive organs: Secondary | ICD-10-CM

## 2023-11-23 DIAGNOSIS — K7689 Other specified diseases of liver: Secondary | ICD-10-CM

## 2023-11-23 NOTE — Patient Instructions (Signed)
Schedule colonoscopy Continue famotidine in 20 mg twice a day Repeat MRCP of the abdomen with and without IV contrast in June 2025

## 2023-11-23 NOTE — Progress Notes (Signed)
Katrinka Blazing, M.D. Gastroenterology & Hepatology [facility] [address] [place], [place] [address] Primary Care Physician: Patient, No Pcp Per No address on file  Referring MD: Self  Chief Complaint: History of piecemeal polyp resection, large polyp  History of Present Illness: Scott Mathis is a 67 y.o. male with past medical history of mycosis fungoides - T-cell lymphoma, prostate cancer, GERD, hypertension, TIA, who presents for evaluation after piecemeal large polyp resection.  Patient reports that he takes famotidine 20 mg twice a day for GERD. He states that this controls his heartburn. Reports that if he eats food that is tomato based or coffee, he may have a flare but he manages with an extra famotidine dose. Denies dysphagia or odynophagia, no burping.  The patient denies having any nausea, vomiting, fever, chills, hematochezia, melena, hematemesis, abdominal distention, abdominal pain, diarrhea, jaundice, pruritus or weight loss.  States that he had EGD and colonoscopy in 2024 for evaluation of abdominal complaints. Was found to have erosive gastritis due to high dose ASA, as well as a large cecal polyp. He is now on aspirin 81 mg.  Patient also underwent evaluation of his abdominal pain with a CT and an MRI of  the abdomen. He was told he had some cysts in the pancreas and liver that required follow up. He was advised to follow these with a gastroenterologist after he moved from [place] to [place].  I reviewed the records faxed by Dr. Conley Rolls (gastroenterologist in the [place] area).  Ultrasound of the abdomen was performed on 09/01/2023 which showed multiple cysts in the liver but no other abnormalities, CBD measures 3 mm, normal gallbladder.  MRI of the abdomen with and without IV contrast showed hyperintense cysts in the liver.  There was presence of left upper quadrant mesenteric lymph nodes.  There was presence of hypodensity in the  tail of the pancreas measuring 10 mm.  CT of the abdomen with IV contrast showed lymph nodes in the left upper quadrant, diverticulosis, hypodensity in the gallbladder measuring 16 x 10 mm.  Labs from 04/01/2023 showed creatinine 0.84, BUN 15, normal electrolytes, total bilirubin 0.9, alkaline phosphatase 60, AST 29, ALT 25, albumin 4.1, CBC WBC 7.8, hemoglobin 14.5, platelets 209 per notes, the patient was advised to have a repeat MRI of the abdomen in June 2025.  He states his abdominal pain has resolved after he was switched to ASA 81 mg.  The patient underwent a colonoscopy on 06/08/2023 with Dr. [facility].  He was found to have the following findings: - The terminal ileum appeared normal.  - A 30 mm polyp was found in the cecum. The polyp was multi-lobulated.  Preparations were made for mucosal resection. 30 mL of Eleview was  injected with adequate lift of the lesion from the muscularis propria.  Needle knife and snare mucosal resection with Lucina Mellow net retrieval was  performed. A 30 mm area was resected. Soft  coag was used around the  resected area. Resection and retrieval were complete. There was no bleeding at the end of the maneuver. To prevent bleeding after mucosal  resection, three hemostatic clips were successfully placed. There was no  bleeding at the end of the procedure. - Non-bleeding internal hemorrhoids were found during retroflexion. The  hemorrhoids were medium-sized.   Pathology showed 2 pieces of a cecal polyp one measuring max size of 2.1 cm and a second piece of 2.1 cm as well.  Patient was  recommended to have a repeat colonoscopy in 6 months for surveillance of piecemeal polypectomy.  Last EGD: 05/01/2023, performed by Dr. Conley Rolls in [facility], normal esophagus, small hiatal hernia, gastritis, normal duodenum.  Biopsies from the stomach showed chronic inactive gastritis.  Negative for H. pylori  Last Colonoscopy: As above  FHx: neg for any  gastrointestinal/liver disease, grandfather and grandfather sister had colon cancer, twin brother B-cell lympoma Social: neg smoking, alcohol or illicit drug use Surgical: no abdominal surgeries  Past Medical History: Past Medical History:  Diagnosis Date   Colon polyps    GERD (gastroesophageal reflux disease)    HLD (hyperlipidemia) 10/20/2019   Hypertension    Lymphoma ([facility])    Stroke [facility])     Past Surgical History: Past Surgical History:  Procedure Laterality Date   BIOPSY  09/09/2018   Procedure: BIOPSY;  Surgeon: Malissa Hippo, MD;  [facility];  Service: Endoscopy;;  gastric polyps   Colonoscopy     COLONOSCOPY     COLONOSCOPY     COLONOSCOPY N/A 05/12/2018   Procedure: COLONOSCOPY;  Surgeon: Malissa Hippo, MD;  [facility];  Service: Endoscopy;  Laterality: N/A;  930   ESOPHAGOGASTRODUODENOSCOPY N/A 09/09/2018   Procedure: ESOPHAGOGASTRODUODENOSCOPY (EGD);  Surgeon: Malissa Hippo, MD;  [facility];  Service: Endoscopy;  Laterality: N/A;  2:45   Right shoulder surgery     TONSILLECTOMY      Family History: Family History  Problem Relation Age of Onset   Colon cancer Maternal Aunt    Colon cancer Maternal Grandfather    Lymphoma Brother     Social History: Social History   Tobacco Use  Smoking Status Never   Passive exposure: Never  Smokeless Tobacco Never   Social History   Substance and Sexual Activity  Alcohol Use No   Social History   Substance and Sexual Activity  Drug Use No    Allergies: No Known Allergies  Medications: Current Outpatient Medications  Medication Sig Dispense Refill   Ascorbic Acid (VITAMIN C) 1000 MG tablet Take 1,000 mg by mouth daily.     aspirin EC 81 MG tablet Take 81 mg by mouth daily. Swallow whole.     Cholecalciferol (VITAMIN D-3) 125 MCG (5000 UT) TABS Take 1 tablet by mouth daily.     Multiple Vitamin (MULTI-VITAMINS) TABS Take 1 tablet by mouth daily.      Omega-3  Fatty Acids (FISH OIL) 1000 MG CAPS Take 1,000 mg by mouth daily.      triamcinolone cream (KENALOG) 0.1 % Apply 1 application topically See admin instructions. Apply to affected area once daily for 2 weeks at a time, taking 1 week off in between as needed.     No current facility-administered medications for this visit.    Review of Systems: GENERAL: negative for malaise, night sweats HEENT: No changes in hearing or vision, no nose bleeds or other nasal problems. NECK: Negative for lumps, goiter, pain and significant neck swelling RESPIRATORY: Negative for cough, wheezing CARDIOVASCULAR: Negative for chest pain, leg swelling, palpitations, orthopnea GI: SEE HPI MUSCULOSKELETAL: Negative for joint pain or swelling, back pain, and muscle pain. SKIN: Negative for lesions, rash PSYCH: Negative for sleep disturbance, mood disorder and recent psychosocial stressors. HEMATOLOGY Negative for prolonged bleeding, bruising easily, and swollen nodes. ENDOCRINE: Negative for cold or heat intolerance, polyuria, polydipsia and goiter. NEURO: negative for tremor, gait imbalance, syncope and seizures. The remainder of the review of systems is noncontributory.  Physical Exam: BP (!) 154/82 (BP Location: Left Arm, Patient Position: Sitting, Cuff Size: Large)   Pulse 64   Temp 97.8 F (36.6 C) (Oral)   Ht 5\' 10"  (1.778 m)   Wt 217 lb 14.4 oz (98.8 kg)   BMI 31.27 kg/m  GENERAL: The patient is AO x3, in no acute distress. HEENT: Head is normocephalic and atraumatic. EOMI are intact. Mouth is well hydrated and without lesions. NECK: Supple. No masses LUNGS: Clear to auscultation. No presence of rhonchi/wheezing/rales. Adequate chest expansion HEART: RRR, normal s1 and s2. ABDOMEN: Soft, nontender, no guarding, no peritoneal signs, and nondistended. BS +. No masses. EXTREMITIES: Without any cyanosis, clubbing, rash, lesions or edema. NEUROLOGIC: AOx3, no focal motor deficit. SKIN: no jaundice, no  rashes   Imaging/Labs: as above  I personally reviewed and interpreted the available labs, imaging and endoscopic files.  Impression and Plan: Scott Mathis is a 67 y.o. male with past medical history of mycosis fungoides - T-cell lymphoma, prostate cancer, GERD, hypertension, TIA, who presents for evaluation after piecemeal large polyp resection.  Patient was found to have a large tubular adenoma that was resected in a piecemeal fashion endoscopically 6 months ago.  Per report, the margins were clear and no malignant abnormalities were found.  He is due for surveillance colonoscopy given the history of piecemeal polypectomy, which will be scheduled.  Patient has had adequate control of his GERD while taking anti-H2 medications, he should continue with the same dosage for now but he can take an extra dose of famotidine if he has breakthrough episodes.  Finally, he will be due for repeat MRI in June 2025 to perform surveillance of the pancreatic lesion/cyst found on previous imaging.  We will add his name to the recall list.  -Schedule colonoscopy Continue famotidine in 20 mg twice a day Repeat MRCP of the abdomen with and without IV contrast in June 2025  All questions were answered.      Katrinka Blazing, MD Gastroenterology and Hepatology [facility]

## 2023-12-02 MED ORDER — PEG 3350-KCL-NA BICARB-NACL 420 G PO SOLR: 4000.0000 mL | Freq: Once | ORAL | 0 refills | Status: AC

## 2023-12-02 NOTE — Addendum Note (Signed)
Addended by: Marlowe Shores on: 12/02/2023 09:53 AM   Modules accepted: Orders

## 2023-12-30 ENCOUNTER — Ambulatory Visit (HOSPITAL_COMMUNITY): Payer: Medicare Other | Admitting: Anesthesiology

## 2023-12-30 ENCOUNTER — Other Ambulatory Visit: Payer: Self-pay

## 2023-12-30 ENCOUNTER — Encounter (HOSPITAL_COMMUNITY)
Admission: RE | Disposition: A | Discharge status: Home / Self Care | Payer: Self-pay | Source: Home / Self Care | Attending: Gastroenterology

## 2023-12-30 ENCOUNTER — Ambulatory Visit (HOSPITAL_COMMUNITY)
Admission: RE | Admit: 2023-12-30 | Discharge: 2023-12-30 | Disposition: A | Discharge status: Home / Self Care | Payer: Medicare Other | Attending: Gastroenterology | Admitting: Gastroenterology

## 2023-12-30 ENCOUNTER — Encounter (HOSPITAL_COMMUNITY): Payer: Self-pay | Admitting: Gastroenterology

## 2023-12-30 DIAGNOSIS — K635 Polyp of colon: Secondary | ICD-10-CM

## 2023-12-30 DIAGNOSIS — K648 Other hemorrhoids: Secondary | ICD-10-CM | POA: Insufficient documentation

## 2023-12-30 DIAGNOSIS — K5730 Diverticulosis of large intestine without perforation or abscess without bleeding: Secondary | ICD-10-CM

## 2023-12-30 DIAGNOSIS — Z8546 Personal history of malignant neoplasm of prostate: Secondary | ICD-10-CM | POA: Insufficient documentation

## 2023-12-30 DIAGNOSIS — Z8572 Personal history of non-Hodgkin lymphomas: Secondary | ICD-10-CM | POA: Insufficient documentation

## 2023-12-30 DIAGNOSIS — K9189 Other postprocedural complications and disorders of digestive system: Secondary | ICD-10-CM

## 2023-12-30 DIAGNOSIS — Z9889 Other specified postprocedural states: Secondary | ICD-10-CM | POA: Diagnosis not present

## 2023-12-30 DIAGNOSIS — K644 Residual hemorrhoidal skin tags: Secondary | ICD-10-CM

## 2023-12-30 DIAGNOSIS — Z860100 Personal history of colon polyps, unspecified: Secondary | ICD-10-CM

## 2023-12-30 DIAGNOSIS — Z860101 Personal history of adenomatous and serrated colon polyps: Secondary | ICD-10-CM

## 2023-12-30 DIAGNOSIS — K219 Gastro-esophageal reflux disease without esophagitis: Secondary | ICD-10-CM | POA: Diagnosis not present

## 2023-12-30 DIAGNOSIS — D123 Benign neoplasm of transverse colon: Secondary | ICD-10-CM | POA: Diagnosis not present

## 2023-12-30 DIAGNOSIS — Z8673 Personal history of transient ischemic attack (TIA), and cerebral infarction without residual deficits: Secondary | ICD-10-CM | POA: Insufficient documentation

## 2023-12-30 DIAGNOSIS — I10 Essential (primary) hypertension: Secondary | ICD-10-CM | POA: Insufficient documentation

## 2023-12-30 DIAGNOSIS — Z09 Encounter for follow-up examination after completed treatment for conditions other than malignant neoplasm: Secondary | ICD-10-CM | POA: Diagnosis present

## 2023-12-30 DIAGNOSIS — Z1211 Encounter for screening for malignant neoplasm of colon: Secondary | ICD-10-CM | POA: Diagnosis not present

## 2023-12-30 HISTORY — PX: COLONOSCOPY WITH PROPOFOL: SHX5780

## 2023-12-30 HISTORY — PX: BIOPSY: SHX5522

## 2023-12-30 HISTORY — PX: POLYPECTOMY: SHX149

## 2023-12-30 LAB — HM COLONOSCOPY

## 2023-12-30 SURGERY — COLONOSCOPY WITH PROPOFOL
Anesthesia: General

## 2023-12-30 MED ORDER — LACTATED RINGERS IV SOLN: INTRAVENOUS | Status: DC

## 2023-12-30 MED ORDER — PROPOFOL 500 MG/50ML IV EMUL
INTRAVENOUS | Status: DC | PRN
  Administered 2023-12-30: 200 ug/kg/min via INTRAVENOUS

## 2023-12-30 MED ORDER — PROPOFOL 10 MG/ML IV BOLUS
INTRAVENOUS | Status: DC | PRN
  Administered 2023-12-30 (×2): 100 mg via INTRAVENOUS

## 2023-12-30 NOTE — H&P (Signed)
 Scott Mathis is an 68 y.o. male.   Chief Complaint: history of large polyp s/p piecemeal polypectomy HPI: Scott Mathis is a 68 y.o. male with past medical history of mycosis fungoides - T-cell lymphoma, prostate cancer, GERD, hypertension, TIA, who presents for evaluation after piecemeal large polyp resection.   The patient denies having any nausea, vomiting, fever, chills, hematochezia, melena, hematemesis, abdominal distention, abdominal pain, diarrhea, jaundice, pruritus or weight loss..  The patient underwent a colonoscopy on 06/08/2023 with Dr. Ozell Sieving at [facility].  He was found to have the following findings: - The terminal ileum appeared normal.  - A 30 mm polyp was found in the cecum. The polyp was multi-lobulated.  Preparations were made for mucosal resection. 30 mL of Eleview was  injected with adequate lift of the lesion from the muscularis propria.  Needle knife and snare mucosal resection with Madelyn net retrieval was  performed. A 30 mm area was resected. Soft  coag was used around the  resected area. Resection and retrieval were complete. There was no bleeding at the end of the maneuver. To prevent bleeding after mucosal  resection, three hemostatic clips were successfully placed. There was no  bleeding at the end of the procedure. - Non-bleeding internal hemorrhoids were found during retroflexion. The  hemorrhoids were medium-sized.    Pathology showed 2 pieces of a cecal polyp one measuring max size of 2.1 cm and a second piece of 2.1 cm as well.   Patient was recommended to have a repeat colonoscopy in 6 months for surveillance of piecemeal polypectomy.  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   Mycosis  fungoides ([facility])    grade 1 A   Pancreas cyst    May 2024   Prostate cancer ([facility]) 08/2020   Stroke ([facility])    TIA (transient ischemic attack) 01/2018   cerebellum - PICA_ Balance/equilibrium issue    Past Surgical History:  Procedure Laterality Date   BIOPSY  09/09/2018   Procedure: BIOPSY;  Surgeon: Golda Claudis PENNER, MD;  [facility];  Service: Endoscopy;;  gastric polyps   Colonoscopy     COLONOSCOPY     COLONOSCOPY     COLONOSCOPY N/A 05/12/2018   Procedure: COLONOSCOPY;  Surgeon: Golda Claudis PENNER, MD;  [facility];  Service: Endoscopy;  Laterality: N/A;  930   ENDOSCOPIC MUCOSAL RESECTION  05/2023   to remove flat polyp in cecum   ESOPHAGOGASTRODUODENOSCOPY N/A 09/09/2018   Procedure: ESOPHAGOGASTRODUODENOSCOPY (EGD);  Surgeon: Golda Claudis PENNER, MD;  [facility];  Service: Endoscopy;  Laterality: N/A;  2:45   PROSTATE BIOPSY  02/2022   RETINAL DETACHMENT SURGERY Bilateral    laser 2008, cyosurgery 2009   Right shoulder surgery  2010   bicep tenidesis, rotator cuff repair, acromioplasty   TONSILLECTOMY     TONSILLECTOMY AND ADENOIDECTOMY     WISDOM TOOTH EXTRACTION      Family History  Problem Relation Age of Onset   Colon cancer Maternal Aunt    Colon cancer Maternal Grandfather    Lymphoma Brother    Social History:  reports that he has never smoked. He has never been  exposed to tobacco smoke. He has never used smokeless tobacco. He reports that he does not drink alcohol and does not use drugs.  Allergies: No Known Allergies  Medications Prior to Admission  Medication Sig Dispense Refill   Ascorbic Acid (VITAMIN C) 1000 MG tablet Take 1,000 mg by mouth daily.     aspirin  EC 81 MG tablet Take 81 mg by mouth daily. Swallow whole.     Cholecalciferol (VITAMIN D -3) 125 MCG (5000 UT) TABS Take 1 tablet by mouth daily. Vit d3 5,000 international units with K 2 200 mcg one daily     famotidine  (PEPCID ) 20 MG tablet Take 20 mg by mouth 2  (two) times daily.     LYCOPENE PO Take by mouth. One daily     Misc Natural Products (PROSTATE HEALTH PO) Take by mouth. One dose daily ( 2 tabs total )     Multiple Vitamin (MULTI-VITAMINS) TABS Take 1 tablet by mouth daily.      olmesartan (BENICAR) 20 MG tablet Take 20 mg by mouth daily.     Omega-3 Fatty Acids (FISH OIL) 1000 MG CAPS Take 1,000 mg by mouth daily.      Probiotic Product (PROBIOTIC DAILY PO) Take by mouth. One daily     triamcinolone cream (KENALOG) 0.1 % Apply 1 application topically See admin instructions. Apply to affected area once daily for 2 weeks at a time, taking 1 week off in between as needed.      No results found for this or any previous visit (from the past 48 hours). No results found.  Review of Systems  All other systems reviewed and are negative.   Blood pressure (!) 145/88, pulse 70, temperature 97.7 F (36.5 C), temperature source Oral, resp. rate 18, height 5' 10 (1.778 m), weight 95.3 kg, SpO2 95%. Physical Exam  GENERAL: The patient is AO x3, in no acute distress. HEENT: Head is normocephalic and atraumatic. EOMI are intact. Mouth is well hydrated and without lesions. NECK: Supple. No masses LUNGS: Clear to auscultation. No presence of rhonchi/wheezing/rales. Adequate chest expansion HEART: RRR, normal s1 and s2. ABDOMEN: Soft, nontender, no guarding, no peritoneal signs, and nondistended. BS +. No masses. EXTREMITIES: Without any cyanosis, clubbing, rash, lesions or edema. NEUROLOGIC: AOx3, no focal motor deficit. SKIN: no jaundice, no rashes  Assessment/Plan Scott Mathis is a 68 y.o. male with past medical history of mycosis fungoides - T-cell lymphoma, prostate cancer, GERD, hypertension, TIA, who presents for evaluation after piecemeal large polyp resection.  Will proceed with anoscopy.  Scott Eartha Flavors, MD 12/30/2023, 10:09 AM

## 2023-12-30 NOTE — Transfer of Care (Signed)
 Immediate Anesthesia Transfer of Care Note  Patient: Scott Mathis  Procedure(s) Performed: COLONOSCOPY WITH PROPOFOL  POLYPECTOMY INTESTINAL BIOPSY  Patient Location: Short Stay  Anesthesia Type:General  Level of Consciousness: awake, alert , oriented, and patient cooperative  Airway & Oxygen Therapy: Patient Spontanous Breathing  Post-op Assessment: Report given to RN, Post -op Vital signs reviewed and stable, and Patient moving all extremities X 4  Post vital signs: Reviewed and stable  Last Vitals:  Vitals Value Taken Time  BP 85/49 12/30/23 1046  Temp 36.4 C 12/30/23 1046  Pulse 71 12/30/23 1046  Resp 19 12/30/23 1046  SpO2 95 % 12/30/23 1046    Last Pain:  Vitals:   12/30/23 1014  TempSrc:   PainSc: 0-No pain      Patients Stated Pain Goal: 8 (12/30/23 1004)  Complications: No notable events documented.

## 2023-12-30 NOTE — Op Note (Signed)
 [facility] Patient Name: Scott Mathis Procedure Date: 12/30/2023 10:01 AM MRN: [id number] Date of Birth: 1956/01/14 Attending MD: Toribio Fortune , , [id number] CSN: [id number] Age: 68 Admit Type: Outpatient Procedure:                Colonoscopy Indications:              Surveillance: Personal history of piecemeal removal                            of large sessile adenoma on last colonoscopy 6                            months ago Providers:                Toribio Fortune, Devere Lodge, Jon Loge Referring MD:              Medicines:                Monitored Anesthesia Care Complications:            No immediate complications. Estimated Blood Loss:     Estimated blood loss: none. Procedure:                Pre-Anesthesia Assessment:                           - Prior to the procedure, a History and Physical                            was performed, and patient medications, allergies                            and sensitivities were reviewed. The patient's                            tolerance of previous anesthesia was reviewed.                           - The risks and benefits of the procedure and the                            sedation options and risks were discussed with the                            patient. All questions were answered and informed                            consent was obtained.                           - ASA Grade Assessment: II - A patient with mild                            systemic disease.                           - Adequate visualization was aided with the  use of                            a transparent cap attached to the distal part of                            the endoscope.                           After obtaining informed consent, the colonoscope                            was passed under direct vision. Throughout the                            procedure, the patient's blood pressure, pulse, and                            oxygen  saturations were monitored continuously. The                            [id number]-[id number] ([id number]) scope was introduced through                            the anus and advanced to the the cecum, identified                            by appendiceal orifice and ileocecal valve. The                            colonoscopy was performed without difficulty. The                            patient tolerated the procedure well. The quality                            of the bowel preparation was excellent. Scope In: 10:17:31 AM Scope Out: 10:43:15 AM Scope Withdrawal Time: 0 hours 16 minutes 0 seconds  Total Procedure Duration: 0 hours 25 minutes 44 seconds  Findings:      Hemorrhoids were found on perianal exam.      A 10 to 12 mm post polypectomy scar was found in the cecum. The scar       tissue was healthy in appearance. Biopsies were taken with a cold       forceps for histology.      Two sessile polyps were found in the transverse colon. The polyps were 2       to 4 mm in size. These polyps were removed with a cold snare. Resection       and retrieval were complete.      Scattered small-mouthed diverticula were found in the sigmoid colon and       descending colon.      Non-bleeding internal hemorrhoids were found during retroflexion. The       hemorrhoids were small. Impression:               - Hemorrhoids  found on perianal exam.                           - Post-polypectomy scar in the cecum. Biopsied.                           - Two 2 to 4 mm polyps in the transverse colon,                            removed with a cold snare. Resected and retrieved.                           - Diverticulosis in the sigmoid colon and in the                            descending colon.                           - Non-bleeding internal hemorrhoids. Moderate Sedation:      Per Anesthesia Care Recommendation:           - Discharge patient to home (ambulatory).                           - Resume previous diet.                            - Await pathology results.                           - Repeat colonoscopy in 3 years for surveillance. Procedure Code(s):        --- Professional ---                           [phone], Colonoscopy, flexible; with removal of                            tumor(s), polyp(s), or other lesion(s) by snare                            technique                           45380, 59, Colonoscopy, flexible; with biopsy,                            single or multiple Diagnosis Code(s):        --- Professional ---                           S01.109, Other specified postprocedural states                           D12.3, Benign neoplasm of transverse colon (hepatic                            flexure or splenic flexure)  K64.8, Other hemorrhoids                           Z09, Encounter for follow-up examination after                            completed treatment for conditions other than                            malignant neoplasm                           Z86.010, Personal history of colonic polyps                           K57.30, Diverticulosis of large intestine without                            perforation or abscess without bleeding CPT copyright 2022 American Medical Association. All rights reserved. The codes documented in this report are preliminary and upon coder review may  be revised to meet current compliance requirements. Toribio Fortune, MD Toribio Fortune,  12/30/2023 10:50:03 AM This report has been signed electronically. Number of Addenda: 0

## 2023-12-30 NOTE — Anesthesia Preprocedure Evaluation (Signed)
 Anesthesia Evaluation  Patient identified by MRN, date of birth, ID band Patient awake    Reviewed: Allergy & Precautions, H&P , NPO status , Patient's Chart, lab work & pertinent test results, reviewed documented beta blocker date and time   Airway Mallampati: II  TM Distance: >3 FB Neck ROM: full    Dental no notable dental hx.    Pulmonary neg pulmonary ROS   Pulmonary exam normal breath sounds clear to auscultation       Cardiovascular Exercise Tolerance: Good hypertension, negative cardio ROS  Rhythm:regular Rate:Normal     Neuro/Psych TIACVA negative neurological ROS  negative psych ROS   GI/Hepatic negative GI ROS, Neg liver ROS,GERD  ,,  Endo/Other  negative endocrine ROS    Renal/GU negative Renal ROS  negative genitourinary   Musculoskeletal   Abdominal   Peds  Hematology negative hematology ROS (+)   Anesthesia Other Findings   Reproductive/Obstetrics negative OB ROS                             Anesthesia Physical Anesthesia Plan  ASA: 3  Anesthesia Plan: General   Post-op Pain Management:    Induction:   PONV Risk Score and Plan: Propofol  infusion  Airway Management Planned:   Additional Equipment:   Intra-op Plan:   Post-operative Plan:   Informed Consent: I have reviewed the patients History and Physical, chart, labs and discussed the procedure including the risks, benefits and alternatives for the proposed anesthesia with the patient or authorized representative who has indicated his/her understanding and acceptance.     Dental Advisory Given  Plan Discussed with: CRNA  Anesthesia Plan Comments:        Anesthesia Quick Evaluation

## 2023-12-30 NOTE — Discharge Instructions (Signed)
 You are being discharged to home.  Resume your previous diet.  We are waiting for your pathology results.  Your physician has recommended a repeat colonoscopy in three years for surveillance.

## 2024-01-01 ENCOUNTER — Encounter (HOSPITAL_COMMUNITY): Payer: Self-pay | Admitting: Gastroenterology

## 2024-01-01 ENCOUNTER — Encounter (INDEPENDENT_AMBULATORY_CARE_PROVIDER_SITE_OTHER): Payer: Self-pay | Admitting: *Deleted

## 2024-01-01 LAB — SURGICAL PATHOLOGY

## 2024-01-01 NOTE — Anesthesia Postprocedure Evaluation (Signed)
 Anesthesia Post Note  Patient: Scott Mathis  Procedure(s) Performed: COLONOSCOPY WITH PROPOFOL  POLYPECTOMY INTESTINAL BIOPSY  Patient location during evaluation: Phase II Anesthesia Type: General Level of consciousness: awake Pain management: pain level controlled Vital Signs Assessment: post-procedure vital signs reviewed and stable Respiratory status: spontaneous breathing and respiratory function stable Cardiovascular status: blood pressure returned to baseline and stable Postop Assessment: no headache and no apparent nausea or vomiting Anesthetic complications: no Comments: Late entry   No notable events documented.   Last Vitals:  Vitals:   12/30/23 1050 12/30/23 1054  BP: (!) 103/50 130/75  Pulse: 70 70  Resp: 18 20  Temp:    SpO2: 95% 96%    Last Pain:  Vitals:   12/30/23 1054  TempSrc:   PainSc: 0-No pain                 Yvonna JINNY Bosworth

## 2024-01-05 ENCOUNTER — Encounter (INDEPENDENT_AMBULATORY_CARE_PROVIDER_SITE_OTHER): Payer: Self-pay | Admitting: *Deleted

## 2024-03-23 ENCOUNTER — Ambulatory Visit (INDEPENDENT_AMBULATORY_CARE_PROVIDER_SITE_OTHER): Payer: Medicare Other | Admitting: Urology

## 2024-03-23 VITALS — BP 163/94 | HR 77

## 2024-03-23 DIAGNOSIS — R350 Frequency of micturition: Secondary | ICD-10-CM

## 2024-03-23 DIAGNOSIS — C61 Malignant neoplasm of prostate: Secondary | ICD-10-CM

## 2024-03-23 LAB — URINALYSIS, ROUTINE W REFLEX MICROSCOPIC
Bilirubin, UA: NEGATIVE
Glucose, UA: NEGATIVE
Leukocytes,UA: NEGATIVE
Nitrite, UA: NEGATIVE
Protein,UA: NEGATIVE
RBC, UA: NEGATIVE
Specific Gravity, UA: 1.025 (ref 1.005–1.030)
Urobilinogen, Ur: 0.2 mg/dL (ref 0.2–1.0)
pH, UA: 6 (ref 5.0–7.5)

## 2024-03-23 NOTE — Progress Notes (Signed)
 03/23/2024 9:55 AM   Scott Mathis 1956-05-07 [id number]  Referring provider: Vivien Presto, MD [phone],  [place] [address]  Prostate Cancer   HPI: [name] is a 68yo here for evaluation of prostate cancer. He was diagnosed with intermediate risk prostate cancer in 2021. He then relocated to [place]. PSA increased to 11 from 9 in a 5 month period. He had a MRI fusion prostate biopsy in 3/23 which showed Gleason 3+3=6. His PSA at that time was 6.3. IPSS 11 QOL 3 on no BPH therapy.    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   Mycosis fungoides (HCC)    grade 1 A   Pancreas cyst    May 2024   Prostate cancer (HCC) 08/2020   Stroke (HCC)    TIA (transient ischemic attack) 01/2018   cerebellum - PICA_ Balance/equilibrium issue    Surgical History: Past Surgical History:  Procedure Laterality Date   BIOPSY  09/09/2018   Procedure: BIOPSY;  Surgeon: Malissa Hippo, MD;  [facility];  Service: Endoscopy;;  gastric polyps   BIOPSY  12/30/2023   Procedure: BIOPSY;  Surgeon: Dolores Frame, MD;  [facility];  Service: Gastroenterology;;   Colonoscopy     COLONOSCOPY     COLONOSCOPY     COLONOSCOPY N/A 05/12/2018   Procedure: COLONOSCOPY;  Surgeon: Malissa Hippo, MD;  [facility];  Service: Endoscopy;  Laterality: N/A;  930   COLONOSCOPY WITH PROPOFOL N/A 12/30/2023   Procedure: COLONOSCOPY WITH PROPOFOL;  Surgeon: Dolores Frame, MD;  [facility];  Service: Gastroenterology;  Laterality: N/A;  11:45AM;ASA 1   ENDOSCOPIC MUCOSAL RESECTION  05/2023   to remove flat polyp in cecum   ESOPHAGOGASTRODUODENOSCOPY N/A 09/09/2018    Procedure: ESOPHAGOGASTRODUODENOSCOPY (EGD);  Surgeon: Malissa Hippo, MD;  [facility];  Service: Endoscopy;  Laterality: N/A;  2:45   POLYPECTOMY  12/30/2023   Procedure: POLYPECTOMY INTESTINAL;  Surgeon: Dolores Frame, MD;  [facility];  Service: Gastroenterology;;   PROSTATE BIOPSY  02/2022   RETINAL DETACHMENT SURGERY Bilateral    laser 2008, cyosurgery 2009   Right shoulder surgery  2010   bicep tenidesis, rotator cuff repair, acromioplasty   TONSILLECTOMY     TONSILLECTOMY AND ADENOIDECTOMY     WISDOM TOOTH EXTRACTION      Home Medications:  Allergies as of 03/23/2024   No Known Allergies      Medication List        Accurate as of March 23, 2024  9:55 AM. If you have any questions, ask your nurse or doctor.          aspirin EC 81 MG tablet Take 81 mg by mouth daily. Swallow whole.   famotidine 20 MG tablet Commonly known as: PEPCID Take 20 mg by mouth 2 (two) times daily.   Fish Oil 1000 MG Caps Take 1,000 mg by mouth daily.   LYCOPENE PO Take by mouth. One daily   Multi-Vitamins Tabs Take 1 tablet by mouth daily.   olmesartan 20 MG tablet Commonly known as: BENICAR Take 20 mg  by mouth daily.   PROBIOTIC DAILY PO Take by mouth. One daily   PROSTATE HEALTH PO Take by mouth. One dose daily ( 2 tabs total )   triamcinolone cream 0.1 % Commonly known as: KENALOG Apply 1 application topically See admin instructions. Apply to affected area once daily for 2 weeks at a time, taking 1 week off in between as needed.   vitamin C 1000 MG tablet Take 1,000 mg by mouth daily.   Vitamin D-3 125 MCG (5000 UT) Tabs Take 1 tablet by mouth daily. Vit d3 5,000 international units with K 2 200 mcg one daily        Allergies: No Known Allergies  Family History: Family History  Problem Relation Age of Onset   Colon cancer Maternal Aunt    Colon cancer Maternal Grandfather    Lymphoma Brother     Social History:  reports  that he has never smoked. He has never been exposed to tobacco smoke. He has never used smokeless tobacco. He reports that he does not drink alcohol and does not use drugs.  ROS: All other review of systems were reviewed and are negative except what is noted above in HPI  Physical Exam: BP (!) 163/94   Pulse 77   Constitutional:  Alert and oriented, No acute distress. HEENT: [place] AT, moist mucus membranes.  Trachea midline, no masses. Cardiovascular: No clubbing, cyanosis, or edema. Respiratory: Normal respiratory effort, no increased work of breathing. GI: Abdomen is soft, nontender, nondistended, no abdominal masses GU: No CVA tenderness.  Lymph: No cervical or inguinal lymphadenopathy. Skin: No rashes, bruises or suspicious lesions. Neurologic: Grossly intact, no focal deficits, moving all 4 extremities. Psychiatric: Normal mood and affect.  Laboratory Data: Lab Results  Component Value Date   WBC 12.5 (H) 05/15/2020   HGB 16.3 05/15/2020   HCT 50.0 05/15/2020   MCV 94.2 05/15/2020   PLT 207 05/15/2020    Lab Results  Component Value Date   CREATININE 0.87 05/15/2020    No results found for: "PSA"  No results found for: "TESTOSTERONE"  Lab Results  Component Value Date   HGBA1C 5.2 02/01/2018    Urinalysis    Component Value Date/Time   COLORURINE YELLOW 05/15/2020 0747   APPEARANCEUR Clear 07/26/2020 1131   LABSPEC 1.019 05/15/2020 0747   PHURINE 7.0 05/15/2020 0747   GLUCOSEU Negative 07/26/2020 1131   HGBUR NEGATIVE 05/15/2020 0747   BILIRUBINUR Negative 07/26/2020 1131   KETONESUR NEGATIVE 05/15/2020 0747   PROTEINUR Negative 07/26/2020 1131   PROTEINUR NEGATIVE 05/15/2020 0747   NITRITE Negative 07/26/2020 1131   NITRITE NEGATIVE 05/15/2020 0747   LEUKOCYTESUR Negative 07/26/2020 1131   LEUKOCYTESUR NEGATIVE 05/15/2020 0747    Lab Results  Component Value Date   LABMICR Comment 07/26/2020    Pertinent Imaging:  No results found for this or  any previous visit.  No results found for this or any previous visit.  No results found for this or any previous visit.  No results found for this or any previous visit.  No results found for this or any previous visit.  No results found for this or any previous visit.  No results found for this or any previous visit.  No results found for this or any previous visit.   Assessment & Plan:    1. Prostate cancer (HCC) (Primary) Prostate MRI - Urinalysis, Routine w reflex microscopic  2. Frequent urination Patient defers therapy at this time - BLADDER SCAN AMB NON-IMAGING  No follow-ups on file.  Wilkie Aye, MD  [facility] [place]

## 2024-03-29 ENCOUNTER — Encounter: Payer: Self-pay | Admitting: Urology

## 2024-03-29 NOTE — Patient Instructions (Signed)

## 2024-03-31 ENCOUNTER — Ambulatory Visit (HOSPITAL_COMMUNITY)
Admission: RE | Admit: 2024-03-31 | Discharge: 2024-03-31 | Disposition: A | Discharge status: Home / Self Care | Source: Ambulatory Visit | Attending: Urology | Admitting: Urology

## 2024-03-31 DIAGNOSIS — C61 Malignant neoplasm of prostate: Secondary | ICD-10-CM | POA: Insufficient documentation

## 2024-03-31 MED ORDER — GADOBUTROL 1 MMOL/ML IV SOLN
9.5000 mL | Freq: Once | INTRAVENOUS | Status: AC | PRN
  Administered 2024-03-31: 9.5 mL via INTRAVENOUS

## 2024-04-28 ENCOUNTER — Encounter (INDEPENDENT_AMBULATORY_CARE_PROVIDER_SITE_OTHER): Payer: Self-pay | Admitting: *Deleted

## 2024-04-28 ENCOUNTER — Telehealth (INDEPENDENT_AMBULATORY_CARE_PROVIDER_SITE_OTHER): Payer: Self-pay

## 2024-04-28 NOTE — Telephone Encounter (Signed)
 Patient needs a Letter of Medical Necessity Stating he needed the Follow up TCS that was done by Dr. Sammi Crick on 12/30/2023. Patient says he has gotten a statement from his insurance company stating the portion of his bill that was not covered, was billing code [id number](Biopsy of large bowel using endoscopy), per patient he says this was billed as a routine exam. This portion was $895.00. He says he had, had a previous Colonoscopy in [place]  May of 2024, at which time they found and removed a precancerous "flat" polyp, and it was recommended by the doctor there in [place] to have a repeat Colonoscopy in six months, which is what he did, by having Dr. Sammi Crick do the Colonoscopy on December 30, 2023. He says he spoke with [facility] billing and they tried re filing for him, but this did not change the outcome of his bill. He says he needs this letter for Medical Necessity ASAP. I advised that Dr. Sammi Crick is out of the country for several weeks and would not be back into the office until the end of May. Patient says he would like the office manager to call him, as he needs this letter ASAP as he has to have the letter back to them via [employer] by May 26, 2024. Please advise if there is anyone else that can write this letter for this patient. Thanks,

## 2024-05-02 NOTE — Telephone Encounter (Signed)
 Noted, Thanks Reba

## 2024-05-03 NOTE — Telephone Encounter (Signed)
 [address] [address] to Babette Lesches "Chalice Colt"     05/03/24 12:53 PM Good afternoon,   I have sent  [employer] billing a detailed message on your account and the need for a letter.  We do not do the coding in the office however I have asked that the lead coder look this over and see if we need to get you to send your records from previous colonoscopy along with that letter to your insurance and if this was a denial or needed more information or part of your deductible.      Once I hear back and more I will then give you a call to let you know the best way we can help you with this billing issue.      Thank you    Last read by Babette Lesches "Chalice Colt" at 1:03PM on 05/03/2024.

## 2024-05-04 ENCOUNTER — Encounter: Payer: Self-pay | Admitting: Urology

## 2024-05-04 ENCOUNTER — Ambulatory Visit: Admitting: Urology

## 2024-05-04 VITALS — BP 164/83 | HR 88

## 2024-05-04 DIAGNOSIS — C61 Malignant neoplasm of prostate: Secondary | ICD-10-CM

## 2024-05-04 LAB — URINALYSIS, ROUTINE W REFLEX MICROSCOPIC
Bilirubin, UA: NEGATIVE
Glucose, UA: NEGATIVE
Ketones, UA: NEGATIVE
Leukocytes,UA: NEGATIVE
Nitrite, UA: NEGATIVE
Specific Gravity, UA: 1.025 (ref 1.005–1.030)
Urobilinogen, Ur: 1 mg/dL (ref 0.2–1.0)
pH, UA: 6 (ref 5.0–7.5)

## 2024-05-04 LAB — MICROSCOPIC EXAMINATION
Bacteria, UA: NONE SEEN
WBC, UA: NONE SEEN /HPF (ref 0–5)

## 2024-05-04 NOTE — Progress Notes (Signed)
 05/04/2024 9:48 AM   Scott Mathis 1956-05-25 [id number]  Referring provider: Marco Severs, MD [address]  [address] [place],  [place] [address]  Followup after MRI   HPI: Scott Mathis is a 67yo here for followup for prostate cancer. He underwent MRI which showed a large PIRADs 5 lesion of the left base with neurovascular involvement. He had additional PIRADs 4 and 3 lesions on the left. 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   Mycosis fungoides (HCC)    grade 1 A   Pancreas cyst    May 2024   Prostate cancer (HCC) 08/2020   Stroke (HCC)    TIA (transient ischemic attack) 01/2018   cerebellum - PICA_ Balance/equilibrium issue    Surgical History: Past Surgical History:  Procedure Laterality Date   BIOPSY  09/09/2018   Procedure: BIOPSY;  Surgeon: Ruby Corporal, MD;  [facility];  Service: Endoscopy;;  gastric polyps   BIOPSY  12/30/2023   Procedure: BIOPSY;  Surgeon: Urban Garden, MD;  [facility];  Service: Gastroenterology;;   Colonoscopy     COLONOSCOPY     COLONOSCOPY     COLONOSCOPY N/A 05/12/2018   Procedure: COLONOSCOPY;  Surgeon: Ruby Corporal, MD;  [facility];  Service: Endoscopy;  Laterality: N/A;  930   COLONOSCOPY WITH PROPOFOL  N/A 12/30/2023   Procedure: COLONOSCOPY WITH PROPOFOL ;  Surgeon: Urban Garden, MD;  [facility];  Service: Gastroenterology;  Laterality: N/A;  11:45AM;ASA 1   ENDOSCOPIC MUCOSAL RESECTION  05/2023   to remove flat polyp in cecum   ESOPHAGOGASTRODUODENOSCOPY N/A 09/09/2018   Procedure: ESOPHAGOGASTRODUODENOSCOPY (EGD);  Surgeon: Ruby Corporal, MD;  [facility];  Service: Endoscopy;  Laterality: N/A;  2:45   POLYPECTOMY  12/30/2023   Procedure: POLYPECTOMY INTESTINAL;  Surgeon: Urban Garden, MD;  [facility];  Service: Gastroenterology;;   PROSTATE BIOPSY  02/2022   RETINAL DETACHMENT SURGERY Bilateral    laser 2008, cyosurgery 2009   Right shoulder surgery  2010   bicep tenidesis, rotator cuff repair, acromioplasty   TONSILLECTOMY     TONSILLECTOMY AND ADENOIDECTOMY     WISDOM TOOTH EXTRACTION      Home Medications:  Allergies as of 05/04/2024   No Known Allergies      Medication List        Accurate as of May 04, 2024  9:48 AM. If you have any questions, ask your nurse or doctor.          aspirin  EC 81 MG tablet Take 81 mg by mouth daily. Swallow whole.   famotidine 20 MG tablet Commonly known as: PEPCID Take 20 mg by mouth 2 (two) times daily.   Fish Oil 1000 MG Caps Take 1,000 mg by mouth daily.   LYCOPENE PO Take by mouth. One daily   Multi-Vitamins Tabs Take 1 tablet by mouth daily.   olmesartan 20 MG tablet Commonly known as: BENICAR Take 20 mg by mouth daily.   PROBIOTIC DAILY PO Take by mouth. One daily   PROSTATE HEALTH PO  Take by mouth. One dose daily ( 2 tabs total )   triamcinolone cream 0.1 % Commonly known as: KENALOG Apply 1 application topically See admin instructions. Apply to affected area once daily for 2 weeks at a time, taking 1 week off in between as needed.   vitamin C 1000 MG tablet Take 1,000 mg by mouth daily.   Vitamin D -3 125 MCG (5000 UT) Tabs Take 1 tablet by mouth daily. Vit d3 5,000 international units with K 2 200 mcg one daily        Allergies: No Known Allergies  Family History: Family History  Problem Relation Age of Onset   Colon cancer Maternal Aunt    Colon cancer Maternal Grandfather    Lymphoma Brother     Social History:  reports that he has never smoked. He has never been exposed to tobacco smoke.  He has never used smokeless tobacco. He reports that he does not drink alcohol and does not use drugs.  ROS: All other review of systems were reviewed and are negative except what is noted above in HPI  Physical Exam: BP (!) 164/83   Pulse 88   Constitutional:  Alert and oriented, No acute distress. HEENT: [place] AT, moist mucus membranes.  Trachea midline, no masses. Cardiovascular: No clubbing, cyanosis, or edema. Respiratory: Normal respiratory effort, no increased work of breathing. GI: Abdomen is soft, nontender, nondistended, no abdominal masses GU: No CVA tenderness.  Lymph: No cervical or inguinal lymphadenopathy. Skin: No rashes, bruises or suspicious lesions. Neurologic: Grossly intact, no focal deficits, moving all 4 extremities. Psychiatric: Normal mood and affect.  Laboratory Data: Lab Results  Component Value Date   WBC 12.5 (H) 05/15/2020   HGB 16.3 05/15/2020   HCT 50.0 05/15/2020   MCV 94.2 05/15/2020   PLT 207 05/15/2020    Lab Results  Component Value Date   CREATININE 0.87 05/15/2020    No results found for: "PSA"  No results found for: "TESTOSTERONE "  Lab Results  Component Value Date   HGBA1C 5.2 02/01/2018    Urinalysis    Component Value Date/Time   COLORURINE YELLOW 05/15/2020 0747   APPEARANCEUR Clear 03/23/2024 0954   LABSPEC 1.019 05/15/2020 0747   PHURINE 7.0 05/15/2020 0747   GLUCOSEU Negative 03/23/2024 0954   HGBUR NEGATIVE 05/15/2020 0747   BILIRUBINUR Negative 03/23/2024 0954   KETONESUR NEGATIVE 05/15/2020 0747   PROTEINUR Negative 03/23/2024 0954   PROTEINUR NEGATIVE 05/15/2020 0747   NITRITE Negative 03/23/2024 0954   NITRITE NEGATIVE 05/15/2020 0747   LEUKOCYTESUR Negative 03/23/2024 0954   LEUKOCYTESUR NEGATIVE 05/15/2020 0747    Lab Results  Component Value Date   LABMICR Comment 03/23/2024    Pertinent Imaging: MRI  03/31/2024: Images reviewed and discussed with the patient  No results found for this or any  previous visit.  No results found for this or any previous visit.  No results found for this or any previous visit.  No results found for this or any previous visit.  No results found for this or any previous visit.  No results found for this or any previous visit.  No results found for this or any previous visit.  No results found for this or any previous visit.   Assessment & Plan:    1. Prostate cancer (HCC) (Primary) -referral for MRI fusion biopsy - Urinalysis, Routine w reflex microscopic   No follow-ups on file.  Scott Nailer, MD  [facility] [place]

## 2024-05-04 NOTE — Patient Instructions (Signed)

## 2024-05-10 ENCOUNTER — Telehealth (INDEPENDENT_AMBULATORY_CARE_PROVIDER_SITE_OTHER): Payer: Self-pay | Admitting: Gastroenterology

## 2024-05-10 DIAGNOSIS — K862 Cyst of pancreas: Secondary | ICD-10-CM

## 2024-05-10 NOTE — Telephone Encounter (Signed)
 Pt called in and states he received a letter for 1 year MRI follow up. Pt was seen in Dec 2024.  Per office note: Repeat MRCP of the abdomen with and without IV contrast in June 2025  Please advise diagnosis. Thank you  Pt is aware provider is out of the office at this time.

## 2024-05-12 ENCOUNTER — Encounter (INDEPENDENT_AMBULATORY_CARE_PROVIDER_SITE_OTHER): Payer: Self-pay | Admitting: *Deleted

## 2024-05-12 ENCOUNTER — Encounter: Payer: Self-pay | Admitting: *Deleted

## 2024-05-12 ENCOUNTER — Encounter (INDEPENDENT_AMBULATORY_CARE_PROVIDER_SITE_OTHER): Payer: Self-pay

## 2024-05-19 ENCOUNTER — Encounter (INDEPENDENT_AMBULATORY_CARE_PROVIDER_SITE_OTHER): Payer: Self-pay

## 2024-05-19 NOTE — Telephone Encounter (Signed)
 Diagnosis is pancreatic cyst/lesion. Thanks

## 2024-05-19 NOTE — Telephone Encounter (Signed)
 MRCP scheduled for 05/23/24 at 5pm; pt to arrive at 4:30pm. NPO after 1pm. Pt contacted and verbalized understanding.

## 2024-05-19 NOTE — Addendum Note (Signed)
 Addended by: Cindee Mclester on: 05/19/2024 04:24 PM   Modules accepted: Orders

## 2024-05-23 ENCOUNTER — Ambulatory Visit (HOSPITAL_COMMUNITY)
Admission: RE | Admit: 2024-05-23 | Discharge: 2024-05-23 | Disposition: A | Discharge status: Home / Self Care | Source: Ambulatory Visit | Attending: Gastroenterology | Admitting: Gastroenterology

## 2024-05-23 ENCOUNTER — Other Ambulatory Visit (INDEPENDENT_AMBULATORY_CARE_PROVIDER_SITE_OTHER): Payer: Self-pay | Admitting: Gastroenterology

## 2024-05-23 ENCOUNTER — Ambulatory Visit (INDEPENDENT_AMBULATORY_CARE_PROVIDER_SITE_OTHER): Payer: Self-pay | Admitting: Gastroenterology

## 2024-05-23 DIAGNOSIS — K862 Cyst of pancreas: Secondary | ICD-10-CM | POA: Insufficient documentation

## 2024-05-23 MED ORDER — GADOBUTROL 1 MMOL/ML IV SOLN
10.0000 mL | Freq: Once | INTRAVENOUS | Status: AC | PRN
  Administered 2024-05-23: 10 mL via INTRAVENOUS

## 2024-06-21 ENCOUNTER — Telehealth: Payer: Self-pay

## 2024-06-21 NOTE — Telephone Encounter (Signed)
 Patient scheduled for biopsy talk.

## 2024-06-21 NOTE — Telephone Encounter (Signed)
-----   Message from Belvie Clara sent at 06/21/2024  8:27 AM EDT ----- Regarding: FW: biopsy results He needs to see me in the next couple of weeks. Biopsy was posiitve ----- Message ----- From: Cam Morene ORN, MD Sent: 06/14/2024   5:33 PM EDT To: Belvie LITTIE Clara, MD Subject: biopsy results                                 Walterine Belvie, Just wanted to let you know that I did his biopsy today.  I am not gonna be around to call him with his results.  He mentioned that he's not scheduled to see you until September.  Just didn't wanted to give you a heads up in case you wanted to call him sooner then that! Cheers,  bh

## 2024-06-22 ENCOUNTER — Ambulatory Visit (INDEPENDENT_AMBULATORY_CARE_PROVIDER_SITE_OTHER): Admitting: Urology

## 2024-06-22 ENCOUNTER — Encounter: Payer: Self-pay | Admitting: Urology

## 2024-06-22 DIAGNOSIS — C61 Malignant neoplasm of prostate: Secondary | ICD-10-CM

## 2024-06-22 NOTE — Patient Instructions (Signed)

## 2024-06-22 NOTE — Progress Notes (Signed)
 06/22/2024 3:09 PM   Scott Mathis [birth year] [id number]  Referring provider: Corrington, Kip A, MD [phone],  [place] [address]  Followup prostate cancer    HPI: Scott Mathis is a 67yo here for followup after MRI fusion biopsy. Biopsy revealed Gleason 3+4=7 in 6/12 cores all on the left side and 3/3 positive from the ROI on prostate MRI which was also on the left. PSA 11.    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   Mycosis fungoides (HCC)    grade 1 A   Pancreas cyst    May 2024   Prostate cancer (HCC) 08/2020   Stroke (HCC)    TIA (transient ischemic attack) 01/2018   cerebellum - PICA_ Balance/equilibrium issue    Surgical History: Past Surgical History:  Procedure Laterality Date   BIOPSY  09/09/2018   Procedure: BIOPSY;  Surgeon: Golda Claudis PENNER, MD;  [facility];  Service: Endoscopy;;  gastric polyps   BIOPSY  12/30/2023   Procedure: BIOPSY;  Surgeon: Eartha Angelia Sieving, MD;  Location: [facility];  Service: Gastroenterology;;   Colonoscopy     COLONOSCOPY     COLONOSCOPY     COLONOSCOPY N/A 05/12/2018   Procedure: COLONOSCOPY;  Surgeon: Golda Claudis PENNER, MD;  Location: [facility];  Service: Endoscopy;  Laterality: N/A;  930   COLONOSCOPY WITH PROPOFOL  N/A 12/30/2023   Procedure: COLONOSCOPY WITH PROPOFOL ;  Surgeon: Eartha Angelia Sieving, MD;  Location: [facility];  Service: Gastroenterology;  Laterality: N/A;  11:45AM;ASA 1   ENDOSCOPIC MUCOSAL RESECTION  05/2023   to remove flat polyp in cecum   ESOPHAGOGASTRODUODENOSCOPY N/A 09/09/2018   Procedure: ESOPHAGOGASTRODUODENOSCOPY (EGD);  Surgeon: Golda Claudis PENNER, MD;  Location: [facility];  Service: Endoscopy;   Laterality: N/A;  2:45   POLYPECTOMY  12/30/2023   Procedure: POLYPECTOMY INTESTINAL;  Surgeon: Eartha Angelia Sieving, MD;  Location: [facility];  Service: Gastroenterology;;   PROSTATE BIOPSY  02/2022   RETINAL DETACHMENT SURGERY Bilateral    laser 2008, cyosurgery 2009   Right shoulder surgery  2010   bicep tenidesis, rotator cuff repair, acromioplasty   TONSILLECTOMY     TONSILLECTOMY AND ADENOIDECTOMY     WISDOM TOOTH EXTRACTION      Home Medications:  Allergies as of 06/22/2024   No Known Allergies      Medication List        Accurate as of June 22, 2024  3:09 PM. If you have any questions, ask your nurse or doctor.          aspirin  EC 81 MG tablet Take 81 mg by mouth daily. Swallow whole.   famotidine 20 MG tablet Commonly known as: PEPCID Take 20 mg by mouth 2 (two) times daily.   Fish Oil 1000 MG Caps Take 1,000 mg by mouth daily.   LYCOPENE PO Take by mouth. One daily   Multi-Vitamins Tabs Take 1 tablet by mouth daily.   olmesartan 20 MG tablet Commonly known as: BENICAR Take 20 mg by mouth daily.   PROBIOTIC DAILY PO Take by mouth. One daily   PROSTATE HEALTH PO Take by mouth. One  dose daily ( 2 tabs total )   triamcinolone cream 0.1 % Commonly known as: KENALOG Apply 1 application topically See admin instructions. Apply to affected area once daily for 2 weeks at a time, taking 1 week off in between as needed.   vitamin C 1000 MG tablet Take 1,000 mg by mouth daily.   Vitamin D -3 125 MCG (5000 UT) Tabs Take 1 tablet by mouth daily. Vit d3 5,000 international units with K 2 200 mcg one daily        Allergies: No Known Allergies  Family History: Family History  Problem Relation Age of Onset   Colon cancer Maternal Aunt    Colon cancer Maternal Grandfather    Lymphoma Brother     Social History:  reports that he has never smoked. He has never been exposed to tobacco smoke. He has never used smokeless tobacco. He reports that he  does not drink alcohol and does not use drugs.  ROS: All other review of systems were reviewed and are negative except what is noted above in HPI  Physical Exam: There were no vitals taken for this visit.  Constitutional:  Alert and oriented, No acute distress. HEENT: [place] AT, moist mucus membranes.  Trachea midline, no masses. Cardiovascular: No clubbing, cyanosis, or edema. Respiratory: Normal respiratory effort, no increased work of breathing. GI: Abdomen is soft, nontender, nondistended, no abdominal masses GU: No CVA tenderness.  Lymph: No cervical or inguinal lymphadenopathy. Skin: No rashes, bruises or suspicious lesions. Neurologic: Grossly intact, no focal deficits, moving all 4 extremities. Psychiatric: Normal mood and affect.  Laboratory Data: Lab Results  Component Value Date   WBC 12.5 (H) 05/15/2020   HGB 16.3 05/15/2020   HCT 50.0 05/15/2020   MCV 94.2 05/15/2020   PLT 207 05/15/2020    Lab Results  Component Value Date   CREATININE 0.87 05/15/2020    No results found for: PSA  No results found for: TESTOSTERONE   Lab Results  Component Value Date   HGBA1C 5.2 02/01/2018    Urinalysis    Component Value Date/Time   COLORURINE YELLOW 05/15/2020 0747   APPEARANCEUR Clear 05/04/2024 0929   LABSPEC 1.019 05/15/2020 0747   PHURINE 7.0 05/15/2020 0747   GLUCOSEU Negative 05/04/2024 0929   HGBUR NEGATIVE 05/15/2020 0747   BILIRUBINUR Negative 05/04/2024 0929   KETONESUR NEGATIVE 05/15/2020 0747   PROTEINUR Trace 05/04/2024 0929   PROTEINUR NEGATIVE 05/15/2020 0747   NITRITE Negative 05/04/2024 0929   NITRITE NEGATIVE 05/15/2020 0747   LEUKOCYTESUR Negative 05/04/2024 0929   LEUKOCYTESUR NEGATIVE 05/15/2020 0747    Lab Results  Component Value Date   LABMICR See below: 05/04/2024   WBCUA None seen 05/04/2024   LABEPIT 0-10 05/04/2024   MUCUS Present (A) 05/04/2024   BACTERIA None seen 05/04/2024    Pertinent Imaging:  No results found  for this or any previous visit.  No results found for this or any previous visit.  No results found for this or any previous visit.  No results found for this or any previous visit.  No results found for this or any previous visit.  No results found for this or any previous visit.  No results found for this or any previous visit.  No results found for this or any previous visit.   Assessment & Plan:    1. Prostate cancer (HCC) (Primary) I discussed the natural history of unfavorable intermediate risk prostate cancer with the patient and the various treatment options including active surveillance,  RALP, IMRT, brachytherapy, cryotherapy, HIFU and ADT. After discussing the options the patient elects for radiation therapy. He wishes to be referred to [facility] since his brother in law is being treated there   No follow-ups on file.  Belvie Clara, MD  [facility] [place]

## 2024-06-30 ENCOUNTER — Encounter (HOSPITAL_COMMUNITY)
Admission: RE | Admit: 2024-06-30 | Discharge: 2024-06-30 | Disposition: A | Discharge status: Home / Self Care | Source: Ambulatory Visit | Attending: Urology | Admitting: Urology

## 2024-06-30 DIAGNOSIS — C61 Malignant neoplasm of prostate: Secondary | ICD-10-CM | POA: Insufficient documentation

## 2024-06-30 MED ORDER — FLOTUFOLASTAT F 18 GALLIUM 296-5846 MBQ/ML IV SOLN
8.0000 | Freq: Once | INTRAVENOUS | Status: AC
  Administered 2024-06-30: 8.9 via INTRAVENOUS
  Filled 2024-06-30: qty 8

## 2024-07-12 ENCOUNTER — Ambulatory Visit: Payer: Self-pay | Admitting: Urology

## 2024-07-15 ENCOUNTER — Telehealth (INDEPENDENT_AMBULATORY_CARE_PROVIDER_SITE_OTHER): Payer: Self-pay

## 2024-07-15 ENCOUNTER — Other Ambulatory Visit (INDEPENDENT_AMBULATORY_CARE_PROVIDER_SITE_OTHER): Payer: Self-pay | Admitting: Gastroenterology

## 2024-07-15 DIAGNOSIS — K219 Gastro-esophageal reflux disease without esophagitis: Secondary | ICD-10-CM

## 2024-07-15 MED ORDER — FAMOTIDINE 20 MG PO TABS: 20.0000 mg | ORAL_TABLET | Freq: Two times a day (BID) | ORAL | 3 refills | Status: AC

## 2024-07-15 NOTE — Telephone Encounter (Signed)
I spoke with the patient and made him aware. 

## 2024-07-15 NOTE — Telephone Encounter (Signed)
 Patient called stating he has been on Famotidine 20 mg bid and was prescribed by previous doctor in [place]. Needs us  to send a new script to [facility] in [place] today, as he is going out of town. Please advise.

## 2024-07-15 NOTE — Telephone Encounter (Signed)
 Medication sent to pharmacy

## 2024-08-01 ENCOUNTER — Ambulatory Visit: Admitting: Urology

## 2024-10-05 ENCOUNTER — Encounter (INDEPENDENT_AMBULATORY_CARE_PROVIDER_SITE_OTHER): Payer: Self-pay | Admitting: Gastroenterology

## 2025-02-09 ENCOUNTER — Ambulatory Visit: Admitting: Internal Medicine
